# Patient Record
Sex: Female | Born: 1980 | Race: White | Hispanic: No | Marital: Married | Smoking: Never smoker
Health system: Southern US, Community
[De-identification: ages and names within clinical notes are randomized; demographics above are authoritative.]

## PROBLEM LIST (undated history)

## (undated) DIAGNOSIS — N63 Unspecified lump in unspecified breast: Secondary | ICD-10-CM

## (undated) DIAGNOSIS — IMO0002 Reserved for concepts with insufficient information to code with codable children: Secondary | ICD-10-CM

## (undated) HISTORY — DX: Reserved for concepts with insufficient information to code with codable children: IMO0002

## (undated) HISTORY — DX: Unspecified lump in unspecified breast: N63.0

---

## 2002-04-02 HISTORY — PX: BREAST SURGERY: SHX581

## 2004-03-14 ENCOUNTER — Observation Stay: Payer: Self-pay | Admitting: Unknown Physician Specialty

## 2004-05-27 ENCOUNTER — Inpatient Hospital Stay: Payer: Self-pay | Admitting: Unknown Physician Specialty

## 2004-09-30 ENCOUNTER — Emergency Department: Payer: Self-pay | Admitting: Emergency Medicine

## 2007-06-09 ENCOUNTER — Ambulatory Visit: Payer: Self-pay

## 2007-08-22 ENCOUNTER — Inpatient Hospital Stay: Payer: Self-pay

## 2009-04-02 HISTORY — PX: INTRAUTERINE DEVICE INSERTION: SHX323

## 2009-04-10 ENCOUNTER — Emergency Department: Payer: Self-pay | Admitting: Unknown Physician Specialty

## 2009-12-07 ENCOUNTER — Ambulatory Visit: Payer: Self-pay | Admitting: Internal Medicine

## 2010-09-16 ENCOUNTER — Emergency Department: Payer: Self-pay | Admitting: Emergency Medicine

## 2011-09-17 IMAGING — CT CT HEAD WITHOUT CONTRAST
1 series · 16 of 29 positions shown, 20 images · non-contrast
Comparison: none

REASON FOR EXAM: STAT CR#[id number]     Headache
COMMENTS:

[Series 2: soft tissue · axial · 0.39mm/px · z∈[-72,+58]mm · 16 of 29 slices shown, 20 images]
[im 2/29  brain]
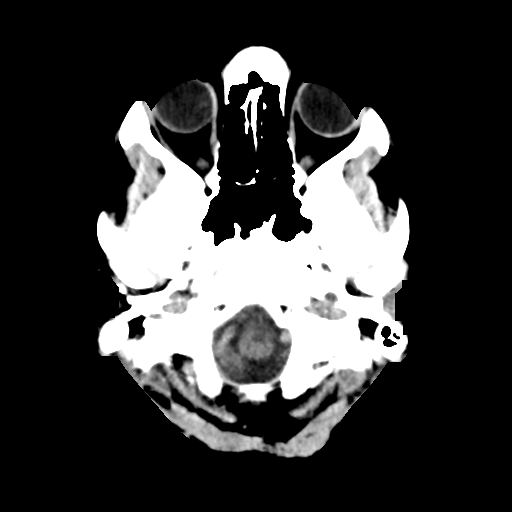
[im 2/29  bone]
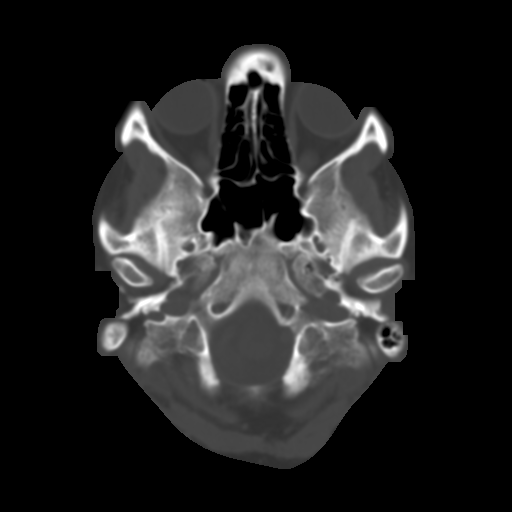
[im 4/29  brain]
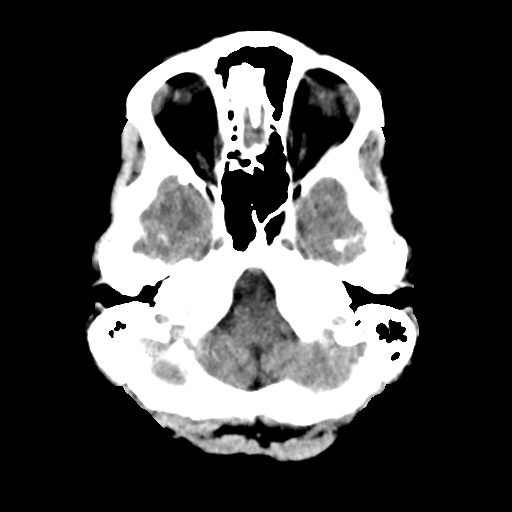
[im 6/29  brain]
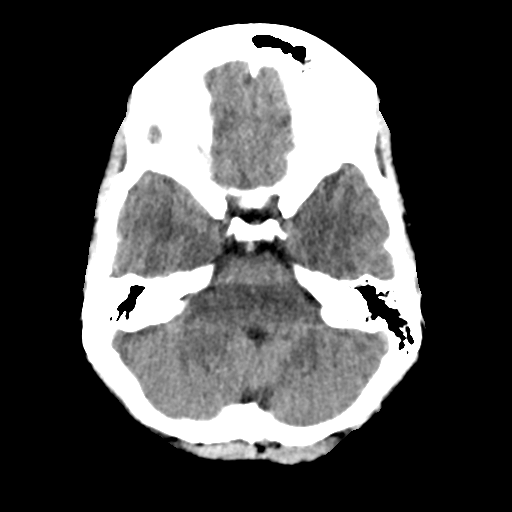
[im 7/29  brain]
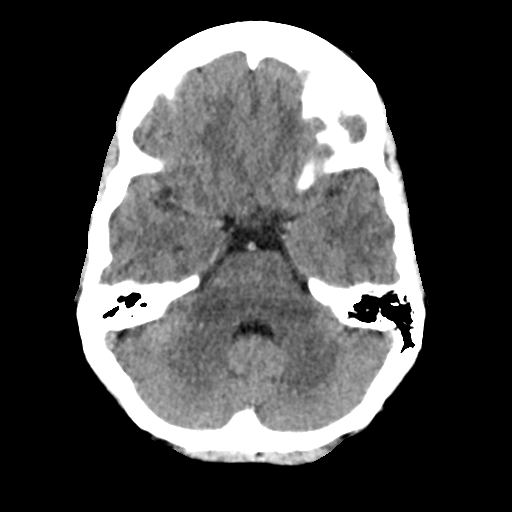
[im 9/29  brain]
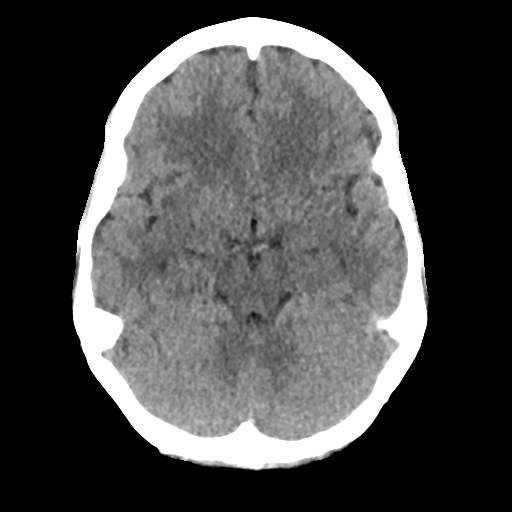
[im 9/29  bone]
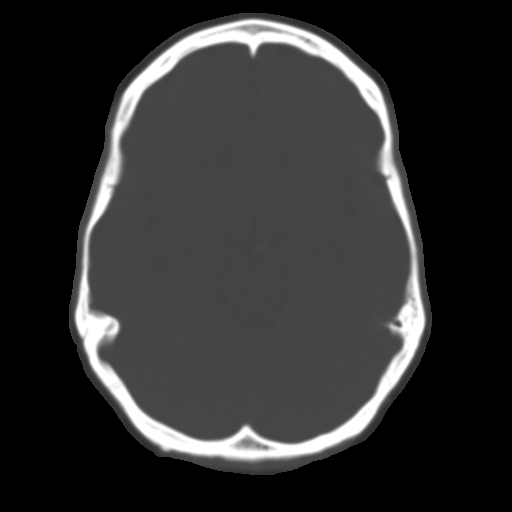
[im 11/29  brain]
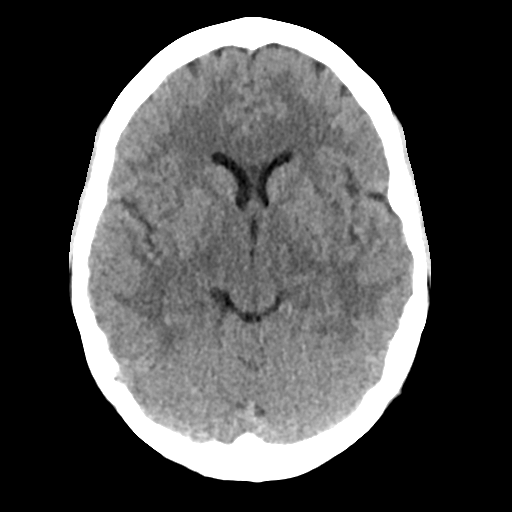
[im 12/29  brain]
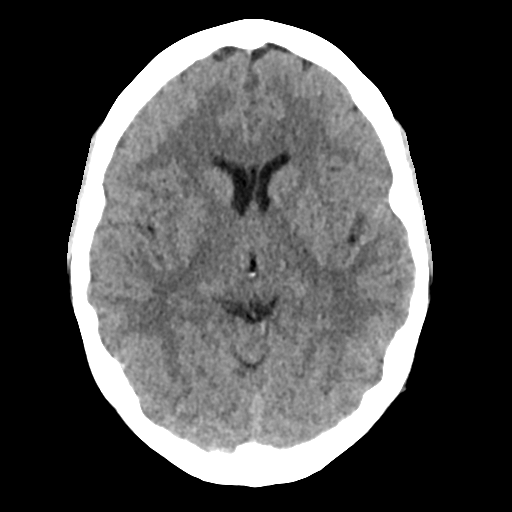
[im 14/29  brain]
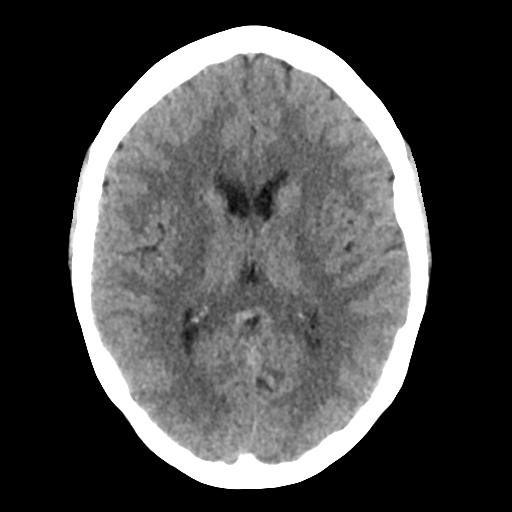
[im 16/29  brain]
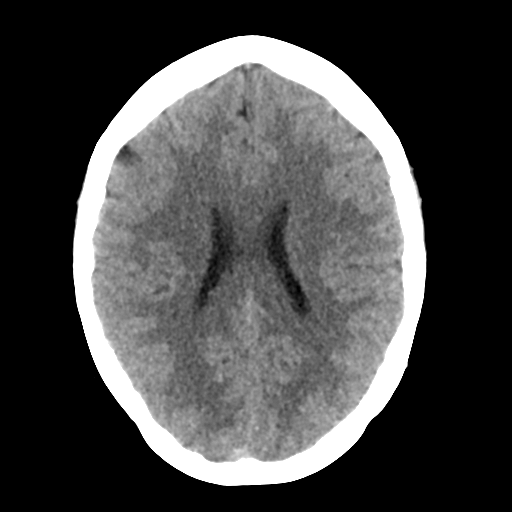
[im 16/29  bone]
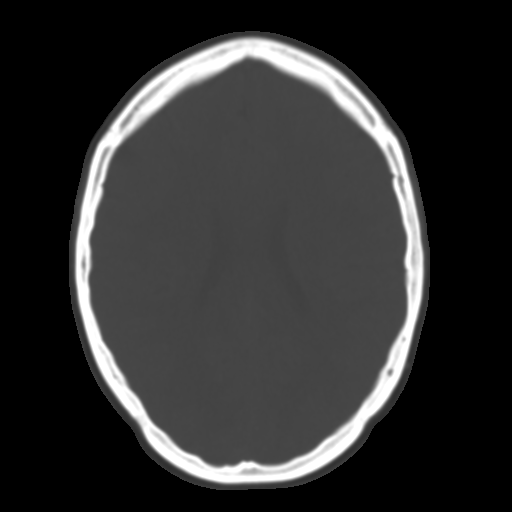
[im 18/29  brain]
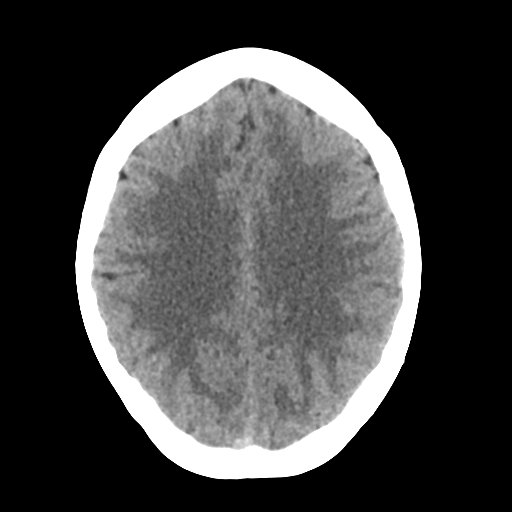
[im 19/29  brain]
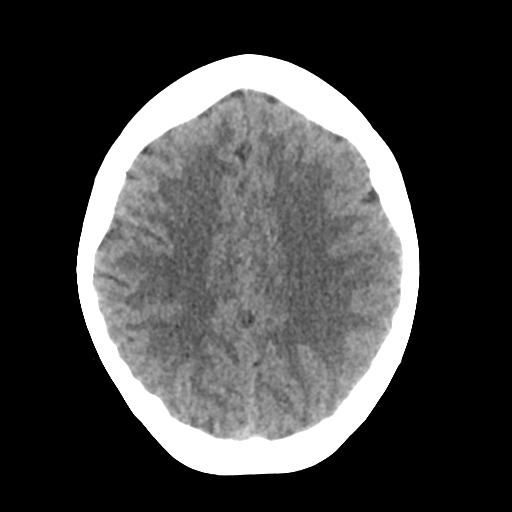
[im 21/29  brain]
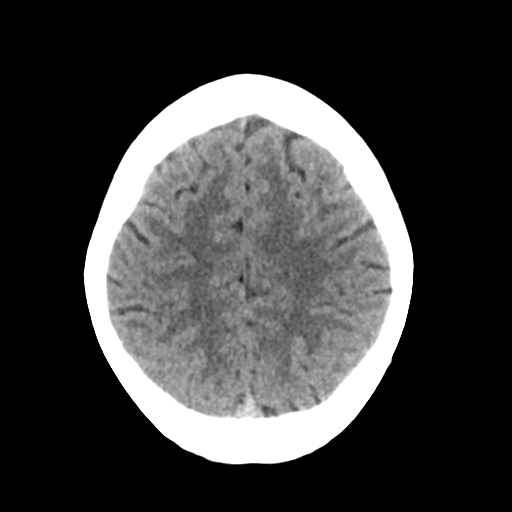
[im 23/29  brain]
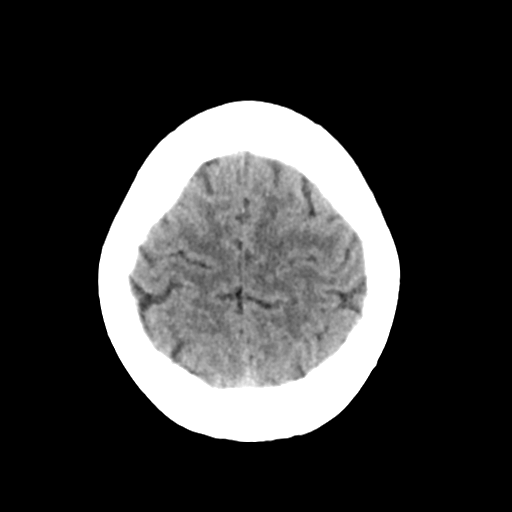
[im 23/29  bone]
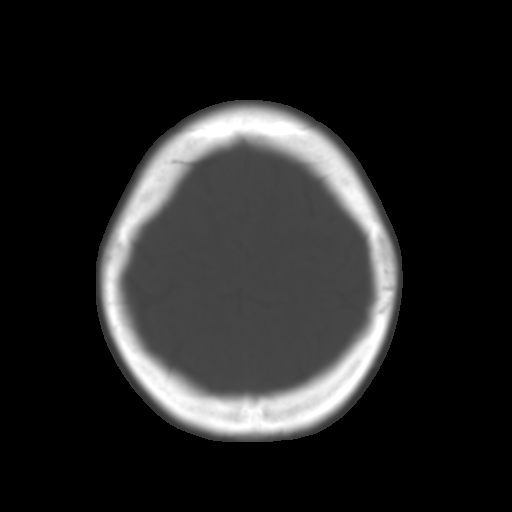
[im 24/29  brain]
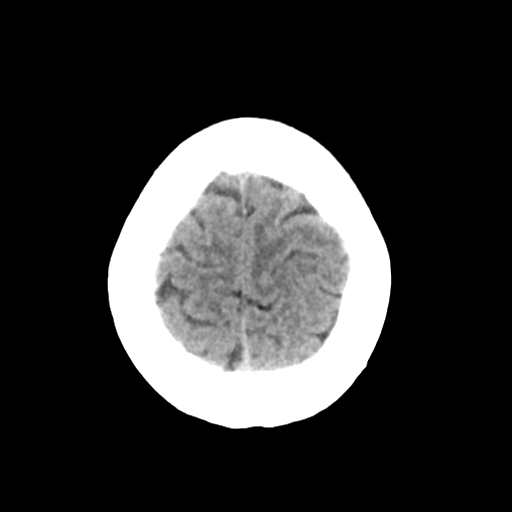
[im 26/29  brain]
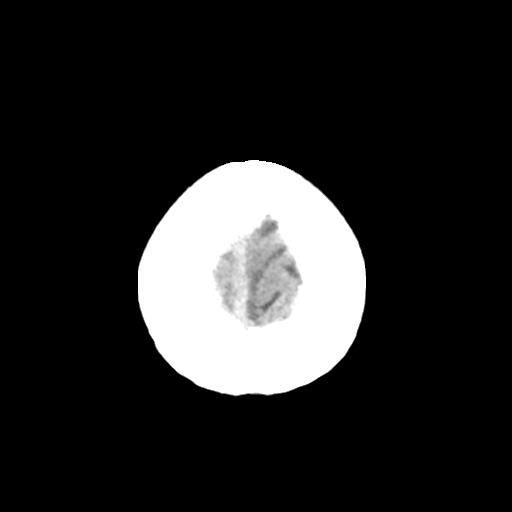
[im 28/29  brain]
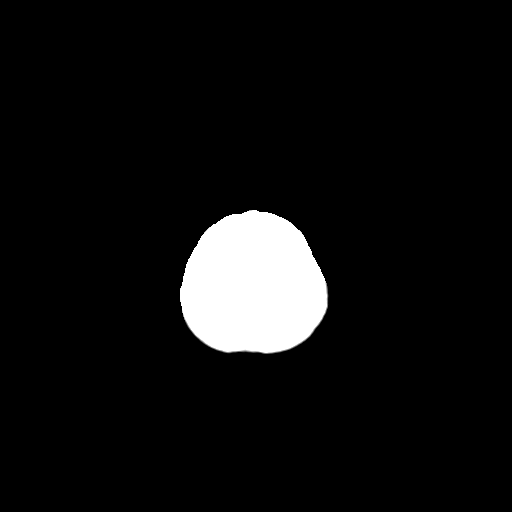

[16 of 29 positions shown; findings below may reference images not displayed]

PROCEDURE:     KCT - KCT HEAD WITHOUT CONTRAST  - December 07, 2009  [DATE]

RESULT:     Noncontrast emergent CT of the brain is performed in the
standard fashion. The patient has no previous examination for comparison.

The ventricles and sulci are normal. There is no hemorrhage. There is no
focal mass, mass-effect or midline shift. There is no evidence of edema or
territorial infarct. The bone windows demonstrate normal aeration of the
paranasal sinuses and mastoid air cells. There is no skull fracture
demonstrated.
IMPRESSION: 1. No acute intracranial abnormality.

## 2012-05-21 DIAGNOSIS — N63 Unspecified lump in unspecified breast: Secondary | ICD-10-CM

## 2012-05-21 HISTORY — DX: Unspecified lump in unspecified breast: N63.0

## 2012-06-19 ENCOUNTER — Emergency Department: Payer: Self-pay | Admitting: Emergency Medicine

## 2012-07-17 ENCOUNTER — Encounter: Payer: Self-pay | Admitting: *Deleted

## 2012-07-17 DIAGNOSIS — N63 Unspecified lump in unspecified breast: Secondary | ICD-10-CM | POA: Insufficient documentation

## 2012-07-17 DIAGNOSIS — IMO0002 Reserved for concepts with insufficient information to code with codable children: Secondary | ICD-10-CM | POA: Insufficient documentation

## 2012-07-22 ENCOUNTER — Ambulatory Visit (INDEPENDENT_AMBULATORY_CARE_PROVIDER_SITE_OTHER): Payer: 59 | Admitting: General Surgery

## 2012-07-22 ENCOUNTER — Other Ambulatory Visit: Payer: Self-pay

## 2012-07-22 ENCOUNTER — Encounter: Payer: Self-pay | Admitting: General Surgery

## 2012-07-22 VITALS — BP 118/82 | HR 80 | Resp 16 | Ht 63.0 in | Wt 168.0 lb

## 2012-07-22 DIAGNOSIS — D249 Benign neoplasm of unspecified breast: Secondary | ICD-10-CM | POA: Insufficient documentation

## 2012-07-22 DIAGNOSIS — D241 Benign neoplasm of right breast: Secondary | ICD-10-CM

## 2012-07-22 NOTE — Progress Notes (Signed)
Patient ID: Julie Buck, female   DOB: 1980/11/24, 32 y.o.   MRN: [id number]  Chief Complaint  Patient presents with  . Follow-up    2 month follow up from right breast biopsy    HPI Julie Buck is a 32 y.o. female who presents for a 2 month follow up from right breast biopsy. The procedure was done on 05/21/2012. 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 The patient denies any new problems with her breasts at this time.  HPI  Past Medical History  Diagnosis Date  . Ulcer     2012  . Lump or mass in breast 05/21/2012    Past Surgical History  Procedure Laterality Date  . Breast surgery Left 2004  . Intrauterine device insertion  2011    History reviewed. No pertinent family history.  Social History History  Substance Use Topics  . Smoking status: Never Smoker   . Smokeless tobacco: Not on file  . Alcohol Use: No    No Known Allergies  Current Outpatient Prescriptions  Medication Sig Dispense Refill  . FLUoxetine (PROZAC) 20 MG capsule Take 20 mg by mouth daily.       No current facility-administered medications for this visit.    Review of Systems Review of Systems  Constitutional: Negative.   Respiratory: Negative.   Cardiovascular: Negative.     Blood pressure 118/82, pulse 80, resp. rate 16, height 5\' 3"  (1.6 m), weight 168 lb (76.204 kg).  Physical Exam Physical Exam  Constitutional: She appears well-developed and well-nourished.  Neck: No thyromegaly present.  Pulmonary/Chest: Right breast exhibits no inverted nipple, no mass, no nipple discharge, no skin change and no tenderness. Left breast exhibits no inverted nipple, no mass, no nipple discharge, no skin change and no tenderness. Breasts are symmetrical.  Lymphadenopathy:    She has no cervical adenopathy.    She has no axillary adenopathy.    Data Reviewed Ultrasound was performed here and shows a stable mass at 2:00 in the right breast  Assessment    Stable fibroadenoma the right breast    Plan   Patient advised no need for excision of this unless it becomes symptomatic or enlarges, we'll continue periodic followup       SANKAR,SEEPLAPUTHUR G 07/23/2012, 5:56 AM

## 2012-07-22 NOTE — Patient Instructions (Addendum)
Patient to return for follow up in 6 months. Call for any breast symptoms.

## 2012-07-23 ENCOUNTER — Encounter: Payer: Self-pay | Admitting: General Surgery

## 2013-01-26 ENCOUNTER — Ambulatory Visit: Payer: 59 | Admitting: General Surgery

## 2013-02-24 ENCOUNTER — Encounter: Payer: Self-pay | Admitting: *Deleted

## 2014-02-01 ENCOUNTER — Encounter: Payer: Self-pay | Admitting: General Surgery

## 2018-06-19 ENCOUNTER — Ambulatory Visit: Payer: Self-pay | Admitting: Certified Nurse Midwife

## 2018-08-11 ENCOUNTER — Ambulatory Visit: Payer: Self-pay | Admitting: Certified Nurse Midwife

## 2020-12-31 ENCOUNTER — Emergency Department
Admission: EM | Admit: 2020-12-31 | Discharge: 2020-12-31 | Disposition: A | Discharge status: Home / Self Care | Payer: Managed Care, Other (non HMO) | Attending: Emergency Medicine | Admitting: Emergency Medicine

## 2020-12-31 ENCOUNTER — Emergency Department: Payer: Managed Care, Other (non HMO)

## 2020-12-31 ENCOUNTER — Encounter: Payer: Self-pay | Admitting: Emergency Medicine

## 2020-12-31 ENCOUNTER — Other Ambulatory Visit: Payer: Self-pay

## 2020-12-31 DIAGNOSIS — S99911A Unspecified injury of right ankle, initial encounter: Secondary | ICD-10-CM | POA: Diagnosis present

## 2020-12-31 DIAGNOSIS — Z853 Personal history of malignant neoplasm of breast: Secondary | ICD-10-CM | POA: Insufficient documentation

## 2020-12-31 DIAGNOSIS — X501XXA Overexertion from prolonged static or awkward postures, initial encounter: Secondary | ICD-10-CM | POA: Insufficient documentation

## 2020-12-31 DIAGNOSIS — S93401A Sprain of unspecified ligament of right ankle, initial encounter: Secondary | ICD-10-CM | POA: Insufficient documentation

## 2020-12-31 MED ORDER — MELOXICAM 15 MG PO TABS: 15.0000 mg | ORAL_TABLET | Freq: Every day | ORAL | 2 refills | Status: DC

## 2020-12-31 NOTE — ED Provider Notes (Signed)
[facility]-EMERGENCY DEPARTMENT  ____________________________________________  Time seen: Approximately 10:56 PM  I have reviewed the triage vital signs and the nursing notes.   HISTORY  Chief Complaint Ankle Pain   Historian Patient     HPI Julie Buck is a 40 y.o. female presents to the emergency with right lateral ankle pain after patient fell off a stepstool and inverted her right ankle.  She did not hit her head or her neck.  She had difficulty bearing weight since injury occurred.  No abrasions or lacerations.  No prior ankle sprains in the past.   Past Medical History:  Diagnosis Date   Lump or mass in breast 05/21/2012   Ulcer    2012     Immunizations up to date:  Yes.     Past Medical History:  Diagnosis Date   Lump or mass in breast 05/21/2012   Ulcer    2012    Patient Active Problem List   Diagnosis Date Noted   Benign neoplasm of breast 07/22/2012   Ulcer    Lump or mass in breast     Past Surgical History:  Procedure Laterality Date   BREAST SURGERY Left 2004   INTRAUTERINE DEVICE INSERTION  2011    Prior to Admission medications   Medication Sig Start Date End Date Taking? Authorizing Provider  meloxicam (MOBIC) 15 MG tablet Take 1 tablet (15 mg total) by mouth daily. 12/31/20 12/31/21 Yes Vallarie Mare M, PA-C  FLUoxetine (PROZAC) 20 MG capsule Take 20 mg by mouth daily.    [provider]    Allergies Patient has no known allergies.  History reviewed. No pertinent family history.  Social History Social History   Tobacco Use   Smoking status: Never  Substance Use Topics   Alcohol use: No   Drug use: No     Review of Systems  Constitutional: No fever/chills Eyes:  No discharge ENT: No upper respiratory complaints. Respiratory: no cough. No SOB/ use of accessory muscles to breath Gastrointestinal:   No nausea, no vomiting.  No diarrhea.  No constipation. Musculoskeletal: Patient has right ankle pain.  Skin:  Negative for rash, abrasions, lacerations, ecchymosis.    ____________________________________________   PHYSICAL EXAM:  VITAL SIGNS: ED Triage Vitals [12/31/20 2159]  Enc Vitals Group     BP 108/85     Pulse Rate (!) 121     Resp 20     Temp 98.1 F (36.7 C)     Temp Source Oral     SpO2 96 %     Weight      Height      Head Circumference      Peak Flow      Pain Score      Pain Loc      Pain Edu?      Excl. in [place]?      Constitutional: Alert and oriented. Well appearing and in no acute distress. Eyes: Conjunctivae are normal. PERRL. EOMI. Head: Atraumatic. ENT:  Cardiovascular: Normal rate, regular rhythm. Normal S1 and S2.  Good peripheral circulation. Respiratory: Normal respiratory effort without tachypnea or retractions. Lungs CTAB. Good air entry to the bases with no decreased or absent breath sounds Gastrointestinal: Bowel sounds x 4 quadrants. Soft and nontender to palpation. No guarding or rigidity. No distention. Musculoskeletal: Patient has tenderness to palpation of the anterior and posterior talofibular ligaments as well as the deltoid ligament.  She is able to move all 5 right toes.  Palpable dorsalis  pedis pulse, right.  Capillary refill less than 2 seconds on the right. Neurologic:  Normal for age. No gross focal neurologic deficits are appreciated.  Skin:  Skin is warm, dry and intact. No rash noted. Psychiatric: Mood and affect are normal for age. Speech and behavior are normal.   ____________________________________________   LABS (all labs ordered are listed, but only abnormal results are displayed)  Labs Reviewed - No data to display ____________________________________________  EKG   ____________________________________________  RADIOLOGY Unk Pinto, personally viewed and evaluated these images (plain radiographs) as part of my medical decision making, as well as reviewing the written report by the radiologist.  DG Ankle Complete  Right  Result Date: 12/31/2020 CLINICAL DATA:  Recent twisting injury with ankle pain and swelling, initial encounter EXAM: RIGHT ANKLE - COMPLETE 3+ VIEW COMPARISON:  09/23/2012 FINDINGS: Soft tissue swelling is noted predominately laterally. No acute fracture or dislocation is noted. IMPRESSION: Soft tissue swelling without acute bony abnormality. Electronically Signed   By: Inez Catalina M.D.   On: 12/31/2020 22:23    ____________________________________________    PROCEDURES  Procedure(s) performed:     Procedures     Medications - No data to display   ____________________________________________   INITIAL IMPRESSION / ASSESSMENT AND PLAN / ED COURSE  Pertinent labs & imaging results that were available during my care of the patient were reviewed by me and considered in my medical decision making (see chart for details).       Assessment and plan Right ankle pain 40 year old female presents to the emergency department with right lateral ankle pain after an inversion type ankle injury.  Patient was tachycardic at triage but vital signs were otherwise reassuring.  She had tenderness to palpation of the anterior and posterior talofibular ligaments and deltoid ligament.  X-ray shows no bony abnormality.  Patient was placed in a boot and crutches were provided.  She was discharged with meloxicam.  Return precautions were given to return with new or worsening symptoms    ____________________________________________  FINAL CLINICAL IMPRESSION(S) / ED DIAGNOSES  Final diagnoses:  Sprain of right ankle, unspecified ligament, initial encounter      NEW MEDICATIONS STARTED DURING THIS VISIT:  ED Discharge Orders          Ordered    meloxicam (MOBIC) 15 MG tablet  Daily        12/31/20 2253                This chart was dictated using voice recognition software/Dragon. Despite best efforts to proofread, errors can occur which can change the meaning. Any  change was purely unintentional.     Lannie Fields, PA-C 12/31/20 2258    Nance Pear, MD 01/01/21 1459

## 2020-12-31 NOTE — ED Triage Notes (Signed)
Pt states she was standing on top of her bed and hanging a wreath. When she jumped down, she twisted her right ankle. Swelling noted. Cms intact. Denies any other injury.

## 2020-12-31 NOTE — Discharge Instructions (Signed)
You can take meloxicam once daily for pain and inflammation.

## 2020-12-31 NOTE — ED Notes (Signed)
Pt declines crutches, has a set at home

## 2021-06-28 ENCOUNTER — Ambulatory Visit: Payer: Managed Care, Other (non HMO) | Admitting: Obstetrics

## 2021-06-28 ENCOUNTER — Other Ambulatory Visit (HOSPITAL_COMMUNITY)
Admission: RE | Admit: 2021-06-28 | Discharge: 2021-06-28 | Disposition: A | Discharge status: Home / Self Care | Payer: Managed Care, Other (non HMO) | Source: Ambulatory Visit | Attending: Obstetrics | Admitting: Obstetrics

## 2021-06-28 VITALS — BP 137/84 | HR 87 | Wt 223.0 lb

## 2021-06-28 DIAGNOSIS — R102 Pelvic and perineal pain: Secondary | ICD-10-CM | POA: Diagnosis not present

## 2021-06-28 NOTE — Progress Notes (Signed)
GYN ENCOUNTER ? ?Encounter for Pelvic Pain ? ?Subjective ? ?HPI: Julie Buck is a 41 y.o. G2P0 who presents today for pelvic pain and vaginal bleeding. She has had an IUD for several years, but she is not sure exactly when it was placed. She thinks it [place] have been 2014 or 2015. For many years, she did not get a period or have any vaginal bleeding. However, for the past year, she has been having progressively worse bleeding and pain. She now has pain and bleeding with intercourse and with straining. She reports that the bleeding is bright red and typically lasts for a day or two, stops, and then resumes again. She also has some bleeding unrelated to intercourse. She is bleeding about 2 weeks out of every month. She denies fever, vaginal discharge or odor, and urinary symptoms. She currently has one sexual partner. She has no personal history of fibroids or uterine cysts.  ? ?Past Medical History:  ?Diagnosis Date  ? Lump or mass in breast 05/21/2012  ? Ulcer   ? 2012  ? ?Past Surgical History:  ?Procedure Laterality Date  ? BREAST SURGERY Left 2004  ? INTRAUTERINE DEVICE INSERTION  2011  ? ?OB History   ? ? Gravida  ?2  ? Para  ?   ? Term  ?   ? Preterm  ?   ? AB  ?   ? Living  ?2  ?  ? ? SAB  ?   ? IAB  ?   ? Ectopic  ?   ? Multiple  ?   ? Live Births  ?   ?   ?  ? Obstetric Comments  ?1st Menstrual Cycle:  15 ?1st Pregnancy:  22  ?  ? ?  ? ?No Known Allergies ? ?Negative except as noted in HPI ?History obtained from the patient ? ?Objective ? ?BP 137/84   Pulse 87   Wt 223 lb (101.2 kg)   BMI 39.50 kg/m?  ? ?General appearance: alert, cooperative, appears stated age ?Abdomen: soft, no organomegaly. Tenderness to palpation in lower left quadrant. ?Pelvic: External genitalia normal, Vagina normal without discharge, cervix normal in appearance, not friable. Nabothian cyst noted, no polyps, IUD strings visualized. Brown blood noted in os. + cervical motion tenderness. No masses felt on bimanual exam. Pain with  manipulation of cervix. ? ?Assessment ?1) Dyspareunia and abnormal bleeding: IUD malposition vs. infection vs. uterine/ovarian cause ? ?Plan ?Discussed possible causes of pelvic pain. Reviewed that IUD is likely expired at this point. Bleeding appears to be uterine and not from a cervical lesion. Offered to remove IUD today. Discussed [place] to determine position of IUD and investigate possible fibroids or ovarian cysts. Julie Buck declines STI testing but would like a swab for BV and yeast.  ?She strongly desires to avoid a future pregnancy. We discussed options for birth control, including BTL. She is considering that and would like to keep her IUD in place today.  ? ?She plans to schedule a pelvic US followed by removal and replacement of her IUD. ? ? ?Julie Buck, CNM ? ? ?  ?

## 2021-07-03 LAB — CERVICOVAGINAL ANCILLARY ONLY
Bacterial Vaginitis (gardnerella): NEGATIVE
Candida Glabrata: NEGATIVE
Candida Vaginitis: NEGATIVE
Comment: NEGATIVE
Comment: NEGATIVE
Comment: NEGATIVE

## 2021-07-16 ENCOUNTER — Encounter: Payer: Self-pay | Admitting: Obstetrics

## 2021-08-08 ENCOUNTER — Encounter: Payer: Managed Care, Other (non HMO) | Admitting: Certified Nurse Midwife

## 2021-08-08 ENCOUNTER — Other Ambulatory Visit: Payer: Managed Care, Other (non HMO)

## 2021-08-10 ENCOUNTER — Encounter: Payer: Managed Care, Other (non HMO) | Admitting: Certified Nurse Midwife

## 2021-08-16 ENCOUNTER — Other Ambulatory Visit: Payer: Managed Care, Other (non HMO)

## 2021-08-22 ENCOUNTER — Emergency Department: Payer: Managed Care, Other (non HMO)

## 2021-08-22 ENCOUNTER — Other Ambulatory Visit: Payer: Self-pay

## 2021-08-22 ENCOUNTER — Encounter: Payer: Self-pay | Admitting: Emergency Medicine

## 2021-08-22 DIAGNOSIS — Z5321 Procedure and treatment not carried out due to patient leaving prior to being seen by health care provider: Secondary | ICD-10-CM | POA: Insufficient documentation

## 2021-08-22 DIAGNOSIS — R110 Nausea: Secondary | ICD-10-CM | POA: Insufficient documentation

## 2021-08-22 DIAGNOSIS — R072 Precordial pain: Secondary | ICD-10-CM | POA: Insufficient documentation

## 2021-08-22 LAB — BASIC METABOLIC PANEL
Anion gap: 8 (ref 5–15)
BUN: 11 mg/dL (ref 6–20)
CO2: 25 mmol/L (ref 22–32)
Calcium: 8.4 mg/dL — ABNORMAL LOW (ref 8.9–10.3)
Chloride: 106 mmol/L (ref 98–111)
Creatinine, Ser: 0.79 mg/dL (ref 0.44–1.00)
GFR, Estimated: >60 mL/min (ref >60)
Glucose, Bld: 104 mg/dL — ABNORMAL HIGH (ref 70–99)
Potassium: 3.2 mmol/L — ABNORMAL LOW (ref 3.5–5.1)
Sodium: 139 mmol/L (ref 135–145)

## 2021-08-22 LAB — TROPONIN I (HIGH SENSITIVITY): Troponin I (High Sensitivity): 2 ng/L (ref <18)

## 2021-08-22 LAB — CBC
HCT: 40 % (ref 36.0–46.0)
Hemoglobin: 13 g/dL (ref 12.0–15.0)
MCH: 29.2 pg (ref 26.0–34.0)
MCHC: 32.5 g/dL (ref 30.0–36.0)
MCV: 89.9 fL (ref 80.0–100.0)
Platelets: 281 10*3/uL (ref 150–400)
RBC: 4.45 MIL/uL (ref 3.87–5.11)
RDW: 11.9 % (ref 11.5–15.5)
WBC: 11.4 10*3/uL — ABNORMAL HIGH (ref 4.0–10.5)
nRBC: 0 % (ref 0.0–0.2)

## 2021-08-22 MED ORDER — ONDANSETRON 4 MG PO TBDP
4.0000 mg | ORAL_TABLET | Freq: Once | ORAL | Status: AC
  Administered 2021-08-22: 4 mg via ORAL
  Filled 2021-08-22: qty 1

## 2021-08-22 NOTE — ED Triage Notes (Signed)
Pt presents via POV with complaints of midsternal CP that started around 4 hours ago. She endorses nausea but denies emesis. Pt denies taking any pain meds PTA. Denies cough, fevers, or chill.

## 2021-08-23 ENCOUNTER — Emergency Department
Admission: EM | Admit: 2021-08-23 | Discharge: 2021-08-23 | Discharge status: Against Medical Advice | Payer: Managed Care, Other (non HMO) | Attending: Emergency Medicine | Admitting: Emergency Medicine

## 2021-08-29 ENCOUNTER — Other Ambulatory Visit: Payer: Self-pay | Admitting: Obstetrics

## 2021-08-29 ENCOUNTER — Encounter: Payer: Self-pay | Admitting: Certified Nurse Midwife

## 2021-08-29 ENCOUNTER — Ambulatory Visit (INDEPENDENT_AMBULATORY_CARE_PROVIDER_SITE_OTHER): Payer: Managed Care, Other (non HMO)

## 2021-08-29 ENCOUNTER — Ambulatory Visit: Payer: Managed Care, Other (non HMO) | Admitting: Certified Nurse Midwife

## 2021-08-29 VITALS — BP 143/87 | HR 75 | Ht 62.0 in | Wt 219.7 lb

## 2021-08-29 DIAGNOSIS — Z30433 Encounter for removal and reinsertion of intrauterine contraceptive device: Secondary | ICD-10-CM

## 2021-08-29 DIAGNOSIS — R102 Pelvic and perineal pain: Secondary | ICD-10-CM

## 2021-08-29 NOTE — Progress Notes (Signed)
   GYNECOLOGY OFFICE PROCEDURE NOTE  Julie Buck is a 41 y.o. G2P0 here for Mirena IUD removal and reinsertion. No GYN concerns.  Last pap smear was on 3-4 yrs ago and was normal.  IUD Removal and Reinsertion  Patient identified, informed consent performed, consent signed.   Discussed risks of irregular bleeding, cramping, infection, malpositioning or misplacement of the IUD outside the uterus which [place] require further procedures. Also discussed >99% contraception efficacy, increased risk of ectopic pregnancy with failure of method.   Emphasized that this did not protect against STIs, condoms recommended during all sexual encounters.Advised to use backup contraception for one week as the risk of pregnancy is higher during the transition period of removing an IUD and replacing it with another one. Time out was performed. Speculum placed in the vagina. The strings of the IUD were grasped and pulled using ring forceps. The IUD was successfully removed in its entirety. The cervix was cleaned with Betadine x 2 and grasped anteriorly with a single tooth tenaculum.  The new mirena IUD insertion apparatus was used to sound the uterus to 8 cm;  the IUD was then placed per manufacturer's recommendations. Strings trimmed to 3 cm. Tenaculum was removed, good hemostasis noted. Patient tolerated procedure well.   Patient was given post-procedure instructions.  She was reminded to have backup contraception for one week during this transition period between IUDs.  Patient was also asked to check IUD strings periodically and follow up in 4 weeks for IUD check/annual exam.   Philip Aspen, CNM

## 2021-08-29 NOTE — Patient Instructions (Signed)
Preventive Care 21-39 Years Old, Female Preventive care refers to lifestyle choices and visits with your health care provider that can promote health and wellness. Preventive care visits are also called wellness exams. What can I expect for my preventive care visit? Counseling During your preventive care visit, your health care provider [place] ask about your: Medical history, including: Past medical problems. Family medical history. Pregnancy history. Current health, including: Menstrual cycle. Method of birth control. Emotional well-being. Home life and relationship well-being. Sexual activity and sexual health. Lifestyle, including: Alcohol, nicotine or tobacco, and drug use. Access to firearms. Diet, exercise, and sleep habits. Work and work environment. Sunscreen use. Safety issues such as seatbelt and bike helmet use. Physical exam Your health care provider [place] check your: Height and weight. These Guagliardo be used to calculate your BMI (body mass index). BMI is a measurement that tells if you are at a healthy weight. Waist circumference. This measures the distance around your waistline. This measurement also tells if you are at a healthy weight and Benassi help predict your risk of certain diseases, such as type 2 diabetes and high blood pressure. Heart rate and blood pressure. Body temperature. Skin for abnormal spots. What immunizations do I need?  Vaccines are usually given at various ages, according to a schedule. Your health care provider will recommend vaccines for you based on your age, medical history, and lifestyle or other factors, such as travel or where you work. What tests do I need? Screening Your health care provider [place] recommend screening tests for certain conditions. This [place] include: Pelvic exam and Pap test. Lipid and cholesterol levels. Diabetes screening. This is done by checking your blood sugar (glucose) after you have not eaten for a while (fasting). Hepatitis  B test. Hepatitis C test. HIV (human immunodeficiency virus) test. STI (sexually transmitted infection) testing, if you are at risk. BRCA-related cancer screening. This [place] be done if you have a family history of breast, ovarian, tubal, or peritoneal cancers. Talk with your health care provider about your test results, treatment options, and if necessary, the need for more tests. Follow these instructions at home: Eating and drinking  Eat a healthy diet that includes fresh fruits and vegetables, whole grains, lean protein, and low-fat dairy products. Take vitamin and mineral supplements as recommended by your health care provider. Do not drink alcohol if: Your health care provider tells you not to drink. You are pregnant, [place] be pregnant, or are planning to become pregnant. If you drink alcohol: Limit how much you have to 0-1 drink a day. Know how much alcohol is in your drink. In the [place] one drink equals one 12 oz bottle of beer (355 mL), one 5 oz glass of wine (148 mL), or one 1 oz glass of hard liquor (44 mL). Lifestyle Brush your teeth every morning and night with fluoride toothpaste. Floss one time each day. Exercise for at least 30 minutes 5 or more days each week. Do not use any products that contain nicotine or tobacco. These products include cigarettes, chewing tobacco, and vaping devices, such as e-cigarettes. If you need help quitting, ask your health care provider. Do not use drugs. If you are sexually active, practice safe sex. Use a condom or other form of protection to prevent STIs. If you do not wish to become pregnant, use a form of birth control. If you plan to become pregnant, see your health care provider for a prepregnancy visit. Find healthy ways to manage stress, such as: Meditation,   yoga, or listening to music. Journaling. Talking to a trusted person. Spending time with friends and family. Minimize exposure to UV radiation to reduce your risk of skin  cancer. Safety Always wear your seat belt while driving or riding in a vehicle. Do not drive: If you have been drinking alcohol. Do not ride with someone who has been drinking. If you have been using any mind-altering substances or drugs. While texting. When you are tired or distracted. Wear a helmet and other protective equipment during sports activities. If you have firearms in your house, make sure you follow all gun safety procedures. Seek help if you have been physically or sexually abused. What's next? Go to your health care provider once a year for an annual wellness visit. Ask your health care provider how often you should have your eyes and teeth checked. Stay up to date on all vaccines. This information is not intended to replace advice given to you by your health care provider. Make sure you discuss any questions you have with your health care provider. Document Revised: 09/14/2020 Document Reviewed: 09/14/2020 Elsevier Patient Education  2023 [employer]  

## 2021-08-30 ENCOUNTER — Other Ambulatory Visit: Payer: Self-pay | Admitting: Family Medicine

## 2021-08-30 DIAGNOSIS — R1013 Epigastric pain: Secondary | ICD-10-CM

## 2021-09-06 ENCOUNTER — Ambulatory Visit
Admission: RE | Admit: 2021-09-06 | Discharge: 2021-09-06 | Disposition: A | Discharge status: Home / Self Care | Payer: Managed Care, Other (non HMO) | Source: Ambulatory Visit | Attending: Family Medicine | Admitting: Family Medicine

## 2021-09-06 DIAGNOSIS — R1013 Epigastric pain: Secondary | ICD-10-CM | POA: Diagnosis present

## 2021-09-15 ENCOUNTER — Ambulatory Visit: Payer: Managed Care, Other (non HMO) | Admitting: Surgery

## 2021-09-15 ENCOUNTER — Encounter: Payer: Self-pay | Admitting: Surgery

## 2021-09-15 ENCOUNTER — Telehealth: Payer: Self-pay | Admitting: Surgery

## 2021-09-15 VITALS — BP 135/86 | HR 83 | Temp 98.6°F | Ht 62.0 in | Wt 213.0 lb

## 2021-09-15 DIAGNOSIS — K8020 Calculus of gallbladder without cholecystitis without obstruction: Secondary | ICD-10-CM

## 2021-09-15 NOTE — Progress Notes (Unsigned)
09/15/2021  Reason for Visit:  Symptomatic cholelithiasis  Requesting Provider:  Threasa Alpha, FNP  History of Present Illness: Julie Buck is a 41 y.o. female presenting for evaluation of cholelithiasis.  The patient reports having RUQ and epigastric pain for a long time.  Initially the pain had been more episodic and intermittent, associated with nausea but no emesis.  More recently, she had an episode of upper epigastric pain and RUQ pain with nausea, and she presented to the ED on 08/22/21.  She had labwork done which had a mildly elevated WBC of 11.4, normal troponin, but the patient left the ED without being seen due to waiting times.  She followed up with her PCP who ordered an outpatient ultrasound.  This was done on 09/06/21, which showed significant cholelithiasis with her gallbladder being full of stones.    Today, the patient reports that her symptoms are almost constant.  There's always some degree of RUQ and epigastric pain or discomfort and some nausea.  She is able to keep food down, but has resorted to small meals at a time.  Sometimes she has diarrhea right after meals and has lost weight recently.  Denies any blood in the stool, other areas of abdominal pain, fevers, chills.  Past Medical History: Past Medical History:  Diagnosis Date   Lump or mass in breast 05/21/2012   Ulcer    2012     Past Surgical History: Past Surgical History:  Procedure Laterality Date   BREAST SURGERY Left 2004   INTRAUTERINE DEVICE INSERTION  2011    Home Medications: Prior to Admission medications   Not on File    Allergies: No Known Allergies  Social History:  reports that she has never smoked. She does not have any smokeless tobacco history on file. She reports that she does not drink alcohol and does not use drugs.   Family History: History reviewed. No pertinent family history.  Review of Systems: Review of Systems  Constitutional:  Negative for chills and fever.  HENT:   Negative for hearing loss.   Respiratory:  Negative for shortness of breath.   Cardiovascular:  Negative for chest pain.  Gastrointestinal:  Positive for abdominal pain, diarrhea and nausea. Negative for vomiting.  Genitourinary:  Negative for dysuria.  Musculoskeletal:  Negative for myalgias.  Skin:  Negative for rash.  Neurological:  Negative for dizziness.  Psychiatric/Behavioral:  Negative for depression.     Physical Exam BP 135/86   Pulse 83   Temp 98.6 F (37 C) (Oral)   Ht '5\' 2"'$  (1.575 m)   Wt 213 lb (96.6 kg)   LMP 08/14/2021 (Exact Date)   SpO2 99%   BMI 38.96 kg/m  CONSTITUTIONAL: No acute distress, well nourished. HEENT:  Normocephalic, atraumatic, extraocular motion intact. NECK: Trachea is midline, and there is no jugular venous distension.  RESPIRATORY:  Lungs are clear, and breath sounds are equal bilaterally. Normal respiratory effort without pathologic use of accessory muscles. CARDIOVASCULAR: Heart is regular without murmurs, gallops, or rubs. GI: The abdomen is soft, non-distended, with some tenderness in the RUQ and epigastric areas.  Negative Murphy's sign.  MUSCULOSKELETAL:  Normal muscle strength and tone in all four extremities.  No peripheral edema or cyanosis. SKIN: Skin turgor is normal. There are no pathologic skin lesions.  NEUROLOGIC:  Motor and sensation is grossly normal.  Cranial nerves are grossly intact. PSYCH:  Alert and oriented to person, place and time. Affect is normal.  Laboratory Analysis: Labs from 08/22/21:  Na 139, K 3.2, Cl 106, CO2 25, BUN 11, Cr 0.79.  WBC 11.4, Hgb 13, Hct 40, Plt 281.  Imaging: Ultrasound Abdomen 09/06/21: IMPRESSION: Cholelithiasis.  Gallbladder is completely filled with stones.  Assessment and Plan: This is a 41 y.o. female with symptomatic cholelithiasis.  --Discussed with the patient the role of the gallbladder and how gallstones form and result in biliary colic episodes.  Her gallbladder per ultrasound  is completely filled with stones, and likely this is the reason for her continued pain and diarrhea.  Discussed with her the need for cholecystectomy, as I do not think any conservative measures are going to help in this case.  She's in agreement. --Discussed with her the plan for a robotic assisted cholecystectomy.  Reviewed the surgery at length with her including the risks of bleeding, infection, injury to surrounding structures, that this is an outpatient surgery, post-op activity restrictions, pain control, the use of ICG, and she's willing to proceed. --Will schedule her for 09/21/21.  Gave the patient return precautions. --All questions have been answered.   I spent 80 minutes dedicated to the care of this patient on the date of this encounter to include pre-visit review of records, face-to-face time with the patient discussing diagnosis and management, and any post-visit coordination of care.   Melvyn Neth, [facility]

## 2021-09-15 NOTE — Telephone Encounter (Signed)
Patient has been advised of Pre-Admission date/time, COVID Testing date and Surgery date.  Surgery Date: 09/21/21 Preadmission Testing Date: 09/19/21 (phone 8a-1p) Covid Testing Date: Not needed.     Patient has been made aware to call [phone], between 1-3:00pm the day before surgery, to find out what time to arrive for surgery.

## 2021-09-15 NOTE — Patient Instructions (Signed)
Our surgery scheduler Barbara will call you within 24-48 hours to get you scheduled. If you have not heard from her after 48 hours, please call our office. Have the blue sheet available when she calls to write down important information.   If you have any concerns or questions, please feel free to call our office.    Minimally Invasive Cholecystectomy  Minimally invasive cholecystectomy is surgery to remove the gallbladder. The gallbladder is a pear-shaped organ that lies beneath the liver on the right side of the body. The gallbladder stores bile, which is a fluid that helps the body digest fats. Cholecystectomy is often done to treat inflammation (irritation and swelling) of the gallbladder (cholecystitis). This condition is usually caused by a buildup of gallstones (cholelithiasis) in the gallbladder or when the fluid in the gall bladder becomes stagnant because gallstones get stuck in the ducts (tubes) and block the flow of bile. This can result in inflammation and pain. In severe cases, emergency surgery No be required. This procedure is done through small incisions in the abdomen, instead of one large incision. It is also called laparoscopic surgery. A thin scope with a camera (laparoscope) is inserted through one incision. Then surgical instruments are inserted through the other incisions. In some cases, a minimally invasive surgery Denne need to be changed to a surgery that is done through a larger incision. This is called open surgery. Tell a health care provider about: Any allergies you have. All medicines you are taking, including vitamins, herbs, eye drops, creams, and over-the-counter medicines. Any problems you or family members have had with anesthetic medicines. Any bleeding problems you have. Any surgeries you have had. Any medical conditions you have. Whether you are pregnant or Thiem be pregnant. What are the risks? Generally, this is a safe procedure. However, problems Manolis occur,  including: Infection. Bleeding. Allergic reactions to medicines. Damage to nearby structures or organs. A gallstone remaining in the common bile duct. The common bile duct carries bile from the gallbladder to the small intestine. A bile leak from the liver or cystic duct after your gallbladder is removed. What happens before the procedure? When to stop eating and drinking Follow instructions from your health care provider about what you Weisensel eat and drink before your procedure. These Schneck include: 8 hours before the procedure Stop eating most foods. Do not eat meat, fried foods, or fatty foods. Eat only light foods, such as toast or crackers. All liquids are okay except energy drinks and alcohol. 6 hours before the procedure Stop eating. Drink only clear liquids, such as water, clear fruit juice, black coffee, plain tea, and sports drinks. Do not drink energy drinks or alcohol. 2 hours before the procedure Stop drinking all liquids. You Malmquist be allowed to take medicines with small sips of water. If you do not follow your health care provider's instructions, your procedure Ringstad be delayed or canceled. Medicines Ask your health care provider about: Changing or stopping your regular medicines. This is especially important if you are taking diabetes medicines or blood thinners. Taking medicines such as aspirin and ibuprofen. These medicines can thin your blood. Do not take these medicines unless your health care provider tells you to take them. Taking over-the-counter medicines, vitamins, herbs, and supplements. General instructions If you will be going home right after the procedure, plan to have a responsible adult: Take you home from the hospital or clinic. You will not be allowed to drive. Care for you for the time you are   told. Do not use any products that contain nicotine or tobacco for at least 4 weeks before the procedure. These products include cigarettes, chewing tobacco, and vaping  devices, such as e-cigarettes. If you need help quitting, ask your health care provider. Ask your health care provider: How your surgery site will be marked. What steps will be taken to help prevent infection. These Gaber include: Removing hair at the surgery site. Washing skin with a germ-killing soap. Taking antibiotic medicine. What happens during the procedure?  An IV will be inserted into one of your veins. You will be given one or both of the following: A medicine to help you relax (sedative). A medicine to make you fall asleep (general anesthetic). Your surgeon will make several small incisions in your abdomen. The laparoscope will be inserted through one of the small incisions. The camera on the laparoscope will send images to a monitor in the operating room. This lets your surgeon see inside your abdomen. A gas will be pumped into your abdomen. This will expand your abdomen to give the surgeon more room to perform the surgery. Other tools that are needed for the procedure will be inserted through the other incisions. The gallbladder will be removed through one of the incisions. Your common bile duct Torok be examined. If stones are found in the common bile duct, they Prasad be removed. After your gallbladder has been removed, the incisions will be closed with stitches (sutures), staples, or skin glue. Your incisions will be covered with a bandage (dressing). The procedure Vanoverbeke vary among health care providers and hospitals. What happens after the procedure? Your blood pressure, heart rate, breathing rate, and blood oxygen level will be monitored until you leave the hospital or clinic. You will be given medicines as needed to control your pain. You Ryer have a drain placed in the incision. The drain will be removed a day or two after the procedure. Summary Minimally invasive cholecystectomy, also called laparoscopic cholecystectomy, is surgery to remove the gallbladder using small  incisions. Tell your health care provider about all the medical conditions you have and all the medicines you are taking for those conditions. Before the procedure, follow instructions about when to stop eating and drinking and changing or stopping medicines. Plan to have a responsible adult care for you for the time you are told after you leave the hospital or clinic. This information is not intended to replace advice given to you by your health care provider. Make sure you discuss any questions you have with your health care provider. Document Revised: 09/20/2020 Document Reviewed: 09/20/2020 Elsevier Patient Education  2023 Elsevier Inc.  

## 2021-09-19 ENCOUNTER — Encounter
Admission: RE | Admit: 2021-09-19 | Discharge: 2021-09-19 | Disposition: A | Discharge status: Home / Self Care | Payer: Managed Care, Other (non HMO) | Source: Ambulatory Visit | Attending: Surgery | Admitting: Surgery

## 2021-09-19 VITALS — Ht 62.0 in | Wt 213.0 lb

## 2021-09-19 DIAGNOSIS — Z01812 Encounter for preprocedural laboratory examination: Secondary | ICD-10-CM

## 2021-09-19 NOTE — Patient Instructions (Addendum)
Your procedure is scheduled on:  Report to Day Surgery. To find out your arrival time please call [phone] between 1PM - 3PM on .  Remember: Instructions that are not followed completely Creasy result in serious medical risk,  up to and including death, or upon the discretion of your surgeon and anesthesiologist your  surgery Fiorillo need to be rescheduled.     _X__ 1. Do not eat food after midnight the night before your procedure.                 No chewing gum or hard candies. You Pembroke drink clear liquids up to 2 hours                 before you are scheduled to arrive for your surgery- DO not drink clear                 liquids within 2 hours of the start of your surgery.                 Clear Liquids include:  water, apple juice without pulp, clear Gatorade, G2 or                  Gatorade Zero (avoid Red/Purple/Blue), Black Coffee or Tea (Do not add                 anything to coffee or tea).  __X__2.  On the morning of surgery brush your teeth with toothpaste and water, you                Roemmich rinse your mouth with mouthwash if you wish.  Do not swallow any toothpaste or mouthwash.     _X__ 3.  No Alcohol for 24 hours before or after surgery.   _X__ 4.  Do Not Smoke or use e-cigarettes For 24 Hours Prior to Your Surgery.                 Do not use any chewable tobacco products for at least 6 hours prior to                 Surgery.  _X__  5.  Do not use any recreational drugs (marijuana, cocaine, heroin, ecstasy, MDMA or other)                For at least one week prior to your surgery.  Combination of these drugs with anesthesia                Gutterman have life threatening results.  ____  6.  Bring all medications with you on the day of surgery if instructed.   __X__  7.  Notify your doctor if there is any change in your medical condition      (cold, fever, infections).     Do not wear jewelry, make-up, hairpins, clips or nail polish. Do not wear lotions,  powders, or perfumes. You Sehgal wear deodorant. Do not shave 48 hours prior to surgery.  Do not bring valuables to the hospital.    [facility] is not responsible for any belongings or valuables.  Contacts, dentures or bridgework Parrella not be worn into surgery. Leave your suitcase in the car. After surgery it Bielinski be brought to your room. For patients admitted to the hospital, discharge time is determined by your treatment team.   Patients discharged the day of surgery will not be allowed to drive home.   Make arrangements for  someone to be with you for the first 24 hours of your Same Day Discharge.   __X__ Take these medicines the morning of surgery with A SIP OF WATER:    1. pantoprazole (PROTONIX) 40 MG   2.   3.   4.  5.  6.  ____ Fleet Enema (as directed)   __X__ Use CHG Soap (Dial Antibacterial Soap) as directed  ____ Use Benzoyl Peroxide Gel as instructed  ____ Use inhalers on the day of surgery  ____ Stop metformin 2 days prior to surgery    ____ Take 1/2 of usual insulin dose the night before surgery. No insulin the morning          of surgery.   ____ Call your PCP, cardiologist, or Pulmonologist if taking Coumadin/Plavix/aspirin and ask when to stop before your surgery.   __X__ One Week prior to surgery- Stop Anti-inflammatories such as Ibuprofen, Aleve, Advil, Motrin, meloxicam (MOBIC), diclofenac, etodolac, ketorolac, Toradol, Daypro, piroxicam, aspirin, Goody's or BC powders. OK TO USE TYLENOL IF NEEDED   __X__ Stop supplements until after surgery.    ____ Bring C-Pap to the hospital.    If you have any questions regarding your pre-procedure instructions,  Please call Pre-admit Testing at [phone]

## 2021-09-20 MED ORDER — ACETAMINOPHEN 500 MG PO TABS: 1000.0000 mg | ORAL_TABLET | ORAL | Status: AC

## 2021-09-20 MED ORDER — CHLORHEXIDINE GLUCONATE CLOTH 2 % EX PADS: 6.0000 | MEDICATED_PAD | Freq: Once | CUTANEOUS | Status: DC

## 2021-09-20 MED ORDER — CEFAZOLIN SODIUM-DEXTROSE 2-4 GM/100ML-% IV SOLN
2.0000 g | INTRAVENOUS | Status: AC
  Administered 2021-09-21: 2 g via INTRAVENOUS

## 2021-09-20 MED ORDER — LACTATED RINGERS IV SOLN: INTRAVENOUS | Status: DC

## 2021-09-20 MED ORDER — CHLORHEXIDINE GLUCONATE CLOTH 2 % EX PADS
6.0000 | MEDICATED_PAD | Freq: Once | CUTANEOUS | Status: AC
  Administered 2021-09-21: 6 via TOPICAL

## 2021-09-20 MED ORDER — INDOCYANINE GREEN 25 MG IV SOLR
2.5000 mg | INTRAVENOUS | Status: AC
  Administered 2021-09-21: 2.5 mg via INTRAVENOUS
  Filled 2021-09-20: qty 1

## 2021-09-20 MED ORDER — GABAPENTIN 300 MG PO CAPS: 300.0000 mg | ORAL_CAPSULE | ORAL | Status: AC

## 2021-09-20 MED ORDER — ORAL CARE MOUTH RINSE: 15.0000 mL | Freq: Once | OROMUCOSAL | Status: AC

## 2021-09-20 MED ORDER — CHLORHEXIDINE GLUCONATE 0.12 % MT SOLN: 15.0000 mL | Freq: Once | OROMUCOSAL | Status: AC

## 2021-09-21 ENCOUNTER — Other Ambulatory Visit: Payer: Self-pay

## 2021-09-21 ENCOUNTER — Encounter
Admission: RE | Disposition: A | Discharge status: Home / Self Care | Payer: Self-pay | Source: Home / Self Care | Attending: Surgery

## 2021-09-21 ENCOUNTER — Ambulatory Visit: Payer: Managed Care, Other (non HMO)

## 2021-09-21 ENCOUNTER — Ambulatory Visit
Admission: RE | Admit: 2021-09-21 | Discharge: 2021-09-21 | Disposition: A | Discharge status: Home / Self Care | Payer: Managed Care, Other (non HMO) | Attending: Surgery | Admitting: Surgery

## 2021-09-21 ENCOUNTER — Encounter: Payer: Self-pay | Admitting: Surgery

## 2021-09-21 DIAGNOSIS — K8010 Calculus of gallbladder with chronic cholecystitis without obstruction: Secondary | ICD-10-CM | POA: Insufficient documentation

## 2021-09-21 DIAGNOSIS — Z6838 Body mass index (BMI) 38.0-38.9, adult: Secondary | ICD-10-CM | POA: Diagnosis not present

## 2021-09-21 DIAGNOSIS — E669 Obesity, unspecified: Secondary | ICD-10-CM | POA: Insufficient documentation

## 2021-09-21 DIAGNOSIS — Z01812 Encounter for preprocedural laboratory examination: Secondary | ICD-10-CM

## 2021-09-21 DIAGNOSIS — K8020 Calculus of gallbladder without cholecystitis without obstruction: Secondary | ICD-10-CM

## 2021-09-21 LAB — POCT PREGNANCY, URINE: Preg Test, Ur: NEGATIVE

## 2021-09-21 SURGERY — CHOLECYSTECTOMY, ROBOT-ASSISTED, LAPAROSCOPIC
Anesthesia: General | Site: Abdomen

## 2021-09-21 MED ORDER — FENTANYL CITRATE (PF) 100 MCG/2ML IJ SOLN
INTRAMUSCULAR | Status: DC | PRN
  Administered 2021-09-21 (×2): 50 ug via INTRAVENOUS

## 2021-09-21 MED ORDER — BUPIVACAINE-EPINEPHRINE (PF) 0.25% -1:200000 IJ SOLN
INTRAMUSCULAR | Status: DC | PRN
  Administered 2021-09-21: 30 mL

## 2021-09-21 MED ORDER — BUPIVACAINE-EPINEPHRINE (PF) 0.5% -1:200000 IJ SOLN
INTRAMUSCULAR | Status: AC
  Filled 2021-09-21: qty 30

## 2021-09-21 MED ORDER — GABAPENTIN 300 MG PO CAPS
ORAL_CAPSULE | ORAL | Status: AC
  Administered 2021-09-21: 300 mg via ORAL
  Filled 2021-09-21: qty 1

## 2021-09-21 MED ORDER — CHLORHEXIDINE GLUCONATE 0.12 % MT SOLN
OROMUCOSAL | Status: AC
  Administered 2021-09-21: 15 mL via OROMUCOSAL
  Filled 2021-09-21: qty 15

## 2021-09-21 MED ORDER — ONDANSETRON HCL 4 MG/2ML IJ SOLN
4.0000 mg | Freq: Once | INTRAMUSCULAR | Status: AC | PRN
  Administered 2021-09-21: 4 mg via INTRAVENOUS

## 2021-09-21 MED ORDER — DEXMEDETOMIDINE (PRECEDEX) IN NS 20 MCG/5ML (4 MCG/ML) IV SYRINGE
PREFILLED_SYRINGE | INTRAVENOUS | Status: DC | PRN
  Administered 2021-09-21 (×3): 4 ug via INTRAVENOUS
  Administered 2021-09-21: 8 ug via INTRAVENOUS

## 2021-09-21 MED ORDER — PROPOFOL 10 MG/ML IV BOLUS
INTRAVENOUS | Status: DC | PRN
  Administered 2021-09-21: 200 mg via INTRAVENOUS

## 2021-09-21 MED ORDER — ROCURONIUM BROMIDE 100 MG/10ML IV SOLN
INTRAVENOUS | Status: DC | PRN
  Administered 2021-09-21: 60 mg via INTRAVENOUS
  Administered 2021-09-21: 10 mg via INTRAVENOUS

## 2021-09-21 MED ORDER — DROPERIDOL 2.5 MG/ML IJ SOLN
INTRAMUSCULAR | Status: AC
  Filled 2021-09-21: qty 2

## 2021-09-21 MED ORDER — ONDANSETRON HCL 4 MG/2ML IJ SOLN
INTRAMUSCULAR | Status: AC
  Filled 2021-09-21: qty 2

## 2021-09-21 MED ORDER — PHENYLEPHRINE HCL (PRESSORS) 10 MG/ML IV SOLN
INTRAVENOUS | Status: DC | PRN
  Administered 2021-09-21 (×4): 160 ug via INTRAVENOUS

## 2021-09-21 MED ORDER — DEXAMETHASONE SODIUM PHOSPHATE 10 MG/ML IJ SOLN
INTRAMUSCULAR | Status: DC | PRN
  Administered 2021-09-21: 10 mg via INTRAVENOUS

## 2021-09-21 MED ORDER — FENTANYL CITRATE (PF) 100 MCG/2ML IJ SOLN
INTRAMUSCULAR | Status: AC
  Filled 2021-09-21: qty 2

## 2021-09-21 MED ORDER — BUPIVACAINE-EPINEPHRINE (PF) 0.25% -1:200000 IJ SOLN
INTRAMUSCULAR | Status: AC
  Filled 2021-09-21: qty 30

## 2021-09-21 MED ORDER — 0.9 % SODIUM CHLORIDE (POUR BTL) OPTIME
TOPICAL | Status: DC | PRN
  Administered 2021-09-21: 500 mL

## 2021-09-21 MED ORDER — ONDANSETRON HCL 4 MG PO TABS: 4.0000 mg | ORAL_TABLET | Freq: Three times a day (TID) | ORAL | 0 refills | Status: AC | PRN

## 2021-09-21 MED ORDER — DEXAMETHASONE SODIUM PHOSPHATE 10 MG/ML IJ SOLN
INTRAMUSCULAR | Status: AC
  Filled 2021-09-21: qty 1

## 2021-09-21 MED ORDER — KETOROLAC TROMETHAMINE 30 MG/ML IJ SOLN
INTRAMUSCULAR | Status: DC | PRN
  Administered 2021-09-21: 30 mg via INTRAVENOUS

## 2021-09-21 MED ORDER — IBUPROFEN 800 MG PO TABS: 800.0000 mg | ORAL_TABLET | Freq: Three times a day (TID) | ORAL | 1 refills | Status: AC | PRN

## 2021-09-21 MED ORDER — CEFAZOLIN SODIUM-DEXTROSE 2-4 GM/100ML-% IV SOLN
INTRAVENOUS | Status: AC
  Filled 2021-09-21: qty 100

## 2021-09-21 MED ORDER — HYDROMORPHONE HCL 1 MG/ML IJ SOLN
INTRAMUSCULAR | Status: DC | PRN
  Administered 2021-09-21: 1 mg via INTRAVENOUS

## 2021-09-21 MED ORDER — MIDAZOLAM HCL 2 MG/2ML IJ SOLN
INTRAMUSCULAR | Status: AC
  Filled 2021-09-21: qty 2

## 2021-09-21 MED ORDER — DROPERIDOL 2.5 MG/ML IJ SOLN
0.6250 mg | Freq: Once | INTRAMUSCULAR | Status: AC
  Administered 2021-09-21: 0.625 mg via INTRAVENOUS

## 2021-09-21 MED ORDER — HYDROMORPHONE HCL 1 MG/ML IJ SOLN
INTRAMUSCULAR | Status: AC
  Filled 2021-09-21: qty 1

## 2021-09-21 MED ORDER — FENTANYL CITRATE (PF) 100 MCG/2ML IJ SOLN
25.0000 ug | INTRAMUSCULAR | Status: DC | PRN
  Administered 2021-09-21 (×2): 25 ug via INTRAVENOUS

## 2021-09-21 MED ORDER — MIDAZOLAM HCL 2 MG/2ML IJ SOLN
INTRAMUSCULAR | Status: DC | PRN
  Administered 2021-09-21: 2 mg via INTRAVENOUS

## 2021-09-21 MED ORDER — EPHEDRINE SULFATE (PRESSORS) 50 MG/ML IJ SOLN
INTRAMUSCULAR | Status: DC | PRN
  Administered 2021-09-21: 10 mg via INTRAVENOUS

## 2021-09-21 MED ORDER — PHENYLEPHRINE 80 MCG/ML (10ML) SYRINGE FOR IV PUSH (FOR BLOOD PRESSURE SUPPORT)
PREFILLED_SYRINGE | INTRAVENOUS | Status: AC
  Filled 2021-09-21: qty 10

## 2021-09-21 MED ORDER — BUPIVACAINE LIPOSOME 1.3 % IJ SUSP
INTRAMUSCULAR | Status: AC
  Filled 2021-09-21: qty 20

## 2021-09-21 MED ORDER — OXYCODONE HCL 5 MG PO TABS: 5.0000 mg | ORAL_TABLET | ORAL | 0 refills | Status: AC | PRN

## 2021-09-21 MED ORDER — LIDOCAINE HCL (CARDIAC) PF 100 MG/5ML IV SOSY
PREFILLED_SYRINGE | INTRAVENOUS | Status: DC | PRN
  Administered 2021-09-21: 100 mg via INTRAVENOUS

## 2021-09-21 MED ORDER — ONDANSETRON HCL 4 MG/2ML IJ SOLN
INTRAMUSCULAR | Status: DC | PRN
  Administered 2021-09-21: 4 mg via INTRAVENOUS

## 2021-09-21 MED ORDER — EPHEDRINE 5 MG/ML INJ
INTRAVENOUS | Status: AC
  Filled 2021-09-21: qty 5

## 2021-09-21 MED ORDER — ACETAMINOPHEN 500 MG PO TABS
ORAL_TABLET | ORAL | Status: AC
  Administered 2021-09-21: 1000 mg via ORAL
  Filled 2021-09-21: qty 2

## 2021-09-21 MED ORDER — SUGAMMADEX SODIUM 200 MG/2ML IV SOLN
INTRAVENOUS | Status: DC | PRN
  Administered 2021-09-21: 300 mg via INTRAVENOUS

## 2021-09-21 MED ORDER — ACETAMINOPHEN 500 MG PO TABS: 1000.0000 mg | ORAL_TABLET | Freq: Four times a day (QID) | ORAL | Status: AC | PRN

## 2021-09-21 SURGICAL SUPPLY — 54 items
ADH SKN CLS APL DERMABOND .7 (GAUZE/BANDAGES/DRESSINGS) ×1
BAG PRESSURE INF REUSE 1000 (BAG) IMPLANT
CANNULA REDUC XI 12-8 STAPL (CANNULA) ×1
CANNULA REDUCER 12-8 DVNC XI (CANNULA) ×1 IMPLANT
CLIP LIGATING HEMO O LOK GREEN (MISCELLANEOUS) ×3 IMPLANT
CUP MEDICINE 2OZ PLAST GRAD ST (MISCELLANEOUS) ×2 IMPLANT
DERMABOND ADVANCED (GAUZE/BANDAGES/DRESSINGS) ×1
DERMABOND ADVANCED .7 DNX12 (GAUZE/BANDAGES/DRESSINGS) ×1 IMPLANT
DRAPE ARM DVNC X/XI (DISPOSABLE) ×4 IMPLANT
DRAPE COLUMN DVNC XI (DISPOSABLE) ×1 IMPLANT
DRAPE DA VINCI XI ARM (DISPOSABLE) ×4
DRAPE DA VINCI XI COLUMN (DISPOSABLE) ×1
ELECT CAUTERY BLADE TIP 2.5 (TIP) ×2
ELECT REM PT RETURN 9FT ADLT (ELECTROSURGICAL) ×2
ELECTRODE CAUTERY BLDE TIP 2.5 (TIP) ×1 IMPLANT
ELECTRODE REM PT RTRN 9FT ADLT (ELECTROSURGICAL) ×1 IMPLANT
GLOVE SURG SYN 7.0 (GLOVE) ×4 IMPLANT
GLOVE SURG SYN 7.0 PF PI (GLOVE) ×2 IMPLANT
GLOVE SURG SYN 7.5  E (GLOVE) ×2
GLOVE SURG SYN 7.5 E (GLOVE) ×2 IMPLANT
GLOVE SURG SYN 7.5 PF PI (GLOVE) ×2 IMPLANT
GOWN STRL REUS W/ TWL LRG LVL3 (GOWN DISPOSABLE) ×4 IMPLANT
GOWN STRL REUS W/TWL LRG LVL3 (GOWN DISPOSABLE) ×8
IRRIGATOR SUCT 8 DISP DVNC XI (IRRIGATION / IRRIGATOR) IMPLANT
IRRIGATOR SUCTION 8MM XI DISP (IRRIGATION / IRRIGATOR)
IV NS 1000ML (IV SOLUTION)
IV NS 1000ML BAXH (IV SOLUTION) IMPLANT
KIT PINK PAD W/HEAD ARE REST (MISCELLANEOUS) ×2
KIT PINK PAD W/HEAD ARM REST (MISCELLANEOUS) ×1 IMPLANT
LABEL OR SOLS (LABEL) ×2 IMPLANT
MANIFOLD NEPTUNE II (INSTRUMENTS) ×2 IMPLANT
NEEDLE HYPO 22GX1.5 SAFETY (NEEDLE) ×2 IMPLANT
NS IRRIG 500ML POUR BTL (IV SOLUTION) ×2 IMPLANT
OBTURATOR OPTICAL STANDARD 8MM (TROCAR) ×1
OBTURATOR OPTICAL STND 8 DVNC (TROCAR) ×1
OBTURATOR OPTICALSTD 8 DVNC (TROCAR) ×1 IMPLANT
PACK LAP CHOLECYSTECTOMY (MISCELLANEOUS) ×2 IMPLANT
PENCIL ELECTRO HAND CTR (MISCELLANEOUS) ×2 IMPLANT
SEAL CANN UNIV 5-8 DVNC XI (MISCELLANEOUS) ×3 IMPLANT
SEAL XI 5MM-8MM UNIVERSAL (MISCELLANEOUS) ×3
SET TUBE SMOKE EVAC HIGH FLOW (TUBING) ×2 IMPLANT
SOLUTION ELECTROLUBE (MISCELLANEOUS) ×2 IMPLANT
SPIKE FLUID TRANSFER (MISCELLANEOUS) ×2 IMPLANT
SPONGE T-LAP 18X18 ~~LOC~~+RFID (SPONGE) IMPLANT
SPONGE T-LAP 4X18 ~~LOC~~+RFID (SPONGE) ×2 IMPLANT
STAPLER CANNULA SEAL DVNC XI (STAPLE) ×1 IMPLANT
STAPLER CANNULA SEAL XI (STAPLE) ×1
SUT MNCRL AB 4-0 PS2 18 (SUTURE) ×2 IMPLANT
SUT VIC AB 3-0 SH 27 (SUTURE)
SUT VIC AB 3-0 SH 27X BRD (SUTURE) IMPLANT
SUT VICRYL 0 AB UR-6 (SUTURE) ×4 IMPLANT
SYS BAG RETRIEVAL 10MM (BASKET) ×2
SYSTEM BAG RETRIEVAL 10MM (BASKET) ×1 IMPLANT
TAPE TRANSPORE STRL 2 31045 (GAUZE/BANDAGES/DRESSINGS) ×2 IMPLANT

## 2021-09-21 NOTE — Op Note (Signed)
  Procedure Date:  09/21/2021  Pre-operative Diagnosis:  Symptomatic cholelithiasis  Post-operative Diagnosis: Symptomatic cholelithiasis  Procedure:  Robotic assisted cholecystectomy with ICG FireFly cholangiogram  Surgeon:  Melvyn Neth, MD  Anesthesia:  General endotracheal  Estimated Blood Loss:  10 ml  Specimens:  gallbladder  Complications:  None  Indications for Procedure:  This is a 41 y.o. female who presents with abdominal pain and workup revealing symptomatic cholelithiasis.  The benefits, complications, treatment options, and expected outcomes were discussed with the patient. The risks of bleeding, infection, recurrence of symptoms, failure to resolve symptoms, bile duct damage, bile duct leak, retained common bile duct stone, bowel injury, and need for further procedures were all discussed with the patient and she was willing to proceed.  Description of Procedure: The patient was correctly identified in the preoperative area and brought into the operating room.  The patient was placed supine with VTE prophylaxis in place.  Appropriate time-outs were performed.  Anesthesia was induced and the patient was intubated.  Appropriate antibiotics were infused.  The abdomen was prepped and draped in a sterile fashion. An infraumbilical incision was made. A cutdown technique was used to enter the abdominal cavity without injury, and a 12 mm robotic port was inserted.  Pneumoperitoneum was obtained with appropriate opening pressures.  Three 8-mm ports were placed in the mid abdomen at the level of the umbilicus under direct visualization.  The DaVinci platform was docked, camera targeted, and instruments were placed under direct visualization.  The gallbladder was identified.  The fundus was grasped and retracted cephalad.  Adhesions were lysed bluntly and with electrocautery. The infundibulum was grasped and retracted laterally, exposing the peritoneum overlying the gallbladder.  This  was incised with electrocautery and extended on either side of the gallbladder.  FireFly cholangiogram was then obtained, and we were able to clearly identify the cystic duct and common bile duct.  The cystic duct and cystic artery were carefully dissected with combination of cautery and blunt dissection.  Both were clipped twice proximally and once distally, cutting in between.  The gallbladder was taken from the gallbladder fossa in a retrograde fashion with electrocautery. The gallbladder was placed in an Endocatch bag. The liver bed was inspected and any bleeding was controlled with electrocautery. The right upper quadrant was then inspected again revealing intact clips, no bleeding, and no ductal injury.   The 8 mm ports were removed under direct visualization and the 12 mm port was removed.  The Endocatch bag was brought out via the umbilical incision. The fascial opening was closed using 0 vicryl suture.  Local anesthetic was infused in all incisions and the incisions were closed with 4-0 Monocryl.  The wounds were cleaned and sealed with DermaBond.  The patient was emerged from anesthesia and extubated and brought to the recovery room for further management.  The patient tolerated the procedure well and all counts were correct at the end of the case.   Melvyn Neth, MD

## 2021-09-21 NOTE — Discharge Instructions (Signed)
AMBULATORY SURGERY  ?DISCHARGE INSTRUCTIONS ? ? ?The drugs that you were given will stay in your system until tomorrow so for the next 24 hours you should not: ? ?Drive an automobile ?Make any legal decisions ?Drink any alcoholic beverage ? ? ?You Manternach resume regular meals tomorrow.  Today it is better to start with liquids and gradually work up to solid foods. ? ?You Swarthout eat anything you prefer, but it is better to start with liquids, then soup and crackers, and gradually work up to solid foods. ? ? ?Please notify your doctor immediately if you have any unusual bleeding, trouble breathing, redness and pain at the surgery site, drainage, fever, or pain not relieved by medication. ? ? ? ?Additional Instructions: ? ? ? ?Please contact your physician with any problems or Same Day Surgery at [phone], Monday through Friday 6 am to 4 pm, or [place] at [place] Main number at [phone].  ?

## 2021-09-21 NOTE — Transfer of Care (Signed)
Immediate Anesthesia Transfer of Care Note  Patient: Julie Buck  Procedure(s) Performed: XI ROBOTIC ASSISTED LAPAROSCOPIC CHOLECYSTECTOMY (Abdomen) INDOCYANINE GREEN FLUORESCENCE IMAGING (ICG) (Abdomen)  Patient Location: PACU  Anesthesia Type:General  Level of Consciousness: drowsy  Airway & Oxygen Therapy: Patient Spontanous Breathing and Patient connected to face mask oxygen  Post-op Assessment: Report given to RN and Post -op Vital signs reviewed and stable  Post vital signs: Reviewed and stable  Last Vitals:  Vitals Value Taken Time  BP    Temp    Pulse    Resp    SpO2      Last Pain:  Vitals:   09/21/21 1152  TempSrc: Temporal  PainSc: 5          Complications: No notable events documented.

## 2021-09-21 NOTE — Interval H&P Note (Signed)
History and Physical Interval Note:  09/21/2021 12:54 PM  [name]  has presented today for surgery, with the diagnosis of symptomatic cholelithiasis.  The various methods of treatment have been discussed with the patient and family. After consideration of risks, benefits and other options for treatment, the patient has consented to  Procedure(s): XI ROBOTIC ASSISTED LAPAROSCOPIC CHOLECYSTECTOMY (N/A) [place] (ICG) (N/A) as a surgical intervention.  The patient's history has been reviewed, patient examined, no change in status, stable for surgery.  I have reviewed the patient's chart and labs.  Questions were answered to the patient's satisfaction.     Julie Buck

## 2021-09-21 NOTE — Anesthesia Procedure Notes (Signed)
Procedure Name: Intubation Date/Time: 09/21/2021 1:16 PM  Performed by: Beverely Low, CRNAPre-anesthesia Checklist: Patient identified, Patient being monitored, Timeout performed, Emergency Drugs available and Suction available Patient Re-evaluated:Patient Re-evaluated prior to induction Oxygen Delivery Method: Circle system utilized Preoxygenation: Pre-oxygenation with 100% oxygen Induction Type: IV induction Ventilation: Mask ventilation without difficulty Laryngoscope Size: Mac and 3 Grade View: Grade I Tube type: Oral Tube size: 7.0 mm Number of attempts: 1 Airway Equipment and Method: Stylet Placement Confirmation: ETT inserted through vocal cords under direct vision, positive ETCO2 and breath sounds checked- equal and bilateral Secured at: 21 cm Tube secured with: Tape Dental Injury: Teeth and Oropharynx as per pre-operative assessment  Comments: Intubated by Everlene Other SRNA

## 2021-09-22 NOTE — Anesthesia Postprocedure Evaluation (Signed)
Anesthesia Post Note  Patient: Julie Buck  Procedure(s) Performed: XI ROBOTIC ASSISTED LAPAROSCOPIC CHOLECYSTECTOMY (Abdomen) INDOCYANINE GREEN FLUORESCENCE IMAGING (ICG) (Abdomen)  Patient location during evaluation: PACU Anesthesia Type: General Level of consciousness: oriented and awake and alert Pain management: pain level controlled Vital Signs Assessment: post-procedure vital signs reviewed and stable Respiratory status: spontaneous breathing and respiratory function stable Cardiovascular status: stable Anesthetic complications: no   No notable events documented.   Last Vitals:  Vitals:   09/21/21 1640 09/21/21 1712  BP: 125/72 112/67  Pulse: 84 85  Resp: 16 16  Temp: 36.6 C   SpO2: 93% 93%    Last Pain:  Vitals:   09/21/21 1640  TempSrc: Temporal  PainSc: 5                  Julie Buck

## 2021-09-25 LAB — SURGICAL PATHOLOGY

## 2021-10-05 ENCOUNTER — Encounter: Payer: Self-pay | Admitting: Physician Assistant

## 2021-10-05 ENCOUNTER — Ambulatory Visit (INDEPENDENT_AMBULATORY_CARE_PROVIDER_SITE_OTHER): Payer: Managed Care, Other (non HMO) | Admitting: Physician Assistant

## 2021-10-05 DIAGNOSIS — K8020 Calculus of gallbladder without cholecystitis without obstruction: Secondary | ICD-10-CM

## 2021-10-05 DIAGNOSIS — Z09 Encounter for follow-up examination after completed treatment for conditions other than malignant neoplasm: Secondary | ICD-10-CM

## 2021-10-05 NOTE — Progress Notes (Signed)
[facility] POST-OP OFFICE VISIT  10/05/2021  HPI: Julie Buck is a 41 y.o. female 14 days s/p robotic assisted laparoscopic cholecystectomy for symptomatic cholelithiasis with Dr Hampton Abbot   She is overall doing well Pain worse at the incision to the right of umbilicus; mostly with movements No fever, chills, nausea, emesis, or bowel changes Tolerating PO No other complaints   Physical Exam: Constitutional: Well appearing female, NAD Abdomen: Soft, soreness at incision to the right of the umbilicus otherwise non-tender, non-distended, no rebound/guarding Skin: Laparoscopic incisions are healing well, no erythema or drainage   Assessment/Plan: This is a 42 y.o. female 14 days s/p robotic assisted laparoscopic cholecystectomy for symptomatic cholelithiasis    - Pain control prn  - Reviewed wound care recommendation  - Reviewed lifting restrictions; 4 weeks total  - Reviewed surgical pathology; [place]  - She can follow up on as needed basis; She understands to call with questions/concerns  -- Edison Simon, PA-C [facility] 10/05/2021, 3:23 PM M-F: 7am - 4pm

## 2021-10-05 NOTE — Patient Instructions (Signed)
If you have any concerns or questions, please feel free to call our office.    GENERAL POST-OPERATIVE PATIENT INSTRUCTIONS   WOUND CARE INSTRUCTIONS:  Keep a dry clean dressing on the wound if there is drainage. The initial bandage Duchene be removed after 24 hours.  Once the wound has quit draining you Azbill leave it open to air.  If clothing rubs against the wound or causes irritation and the wound is not draining you Harn cover it with a dry dressing during the daytime.  Try to keep the wound dry and avoid ointments on the wound unless directed to do so.  If the wound becomes bright red and painful or starts to drain infected material that is not clear, please contact your physician immediately.  If the wound is mildly pink and has a thick firm ridge underneath it, this is normal, and is referred to as a healing ridge.  This will resolve over the next 4-6 weeks.  BATHING: You Vogt shower if you have been informed of this by your surgeon. However, Please do not submerge in a tub, hot tub, or pool until incisions are completely sealed or have been told by your surgeon that you [place] do so.  DIET:  You Reza eat any foods that you can tolerate.  It is a good idea to eat a high fiber diet and take in plenty of fluids to prevent constipation.  If you do become constipated you Garrod want to take a mild laxative or take ducolax tablets on a daily basis until your bowel habits are regular.  Constipation can be very uncomfortable, along with straining, after recent surgery.  ACTIVITY:  You are encouraged to cough and deep breath or use your incentive spirometer if you were given one, every 15-30 minutes when awake.  This will help prevent respiratory complications and low grade fevers post-operatively if you had a general anesthetic.  You Spidle want to hug a pillow when coughing and sneezing to add additional support to the surgical area, if you had abdominal or chest surgery, which will decrease pain during these times.   You are encouraged to walk and engage in light activity for the next two weeks.  You should not lift more than 20 pounds for 6 weeks total after surgery as it could put you at increased risk for complications.  Twenty pounds is roughly equivalent to a plastic bag of groceries. At that time- Listen to your body when lifting, if you have pain when lifting, stop and then try again in a few days. Soreness after doing exercises or activities of daily living is normal as you get back in to your normal routine.  MEDICATIONS:  Try to take narcotic medications and anti-inflammatory medications, such as tylenol, ibuprofen, naprosyn, etc., with food.  This will minimize stomach upset from the medication.  Should you develop nausea and vomiting from the pain medication, or develop a rash, please discontinue the medication and contact your physician.  You should not drive, make important decisions, or operate machinery when taking narcotic pain medication.  SUNBLOCK Use sun block to incision area over the next year if this area will be exposed to sun. This helps decrease scarring and will allow you avoid a permanent darkened area over your incision.  QUESTIONS:  Please feel free to call our office if you have any questions, and we will be glad to assist you. [phone]   

## 2021-10-10 ENCOUNTER — Encounter: Payer: Managed Care, Other (non HMO) | Admitting: Certified Nurse Midwife

## 2023-06-02 IMAGING — CR DG CHEST 2V
2 series · 2 of 2 positions shown · non-contrast
Comparison: None Available.

CLINICAL DATA: Midsternal chest pain and nausea.

EXAM:
CHEST - 2 VIEW

[chest pa]
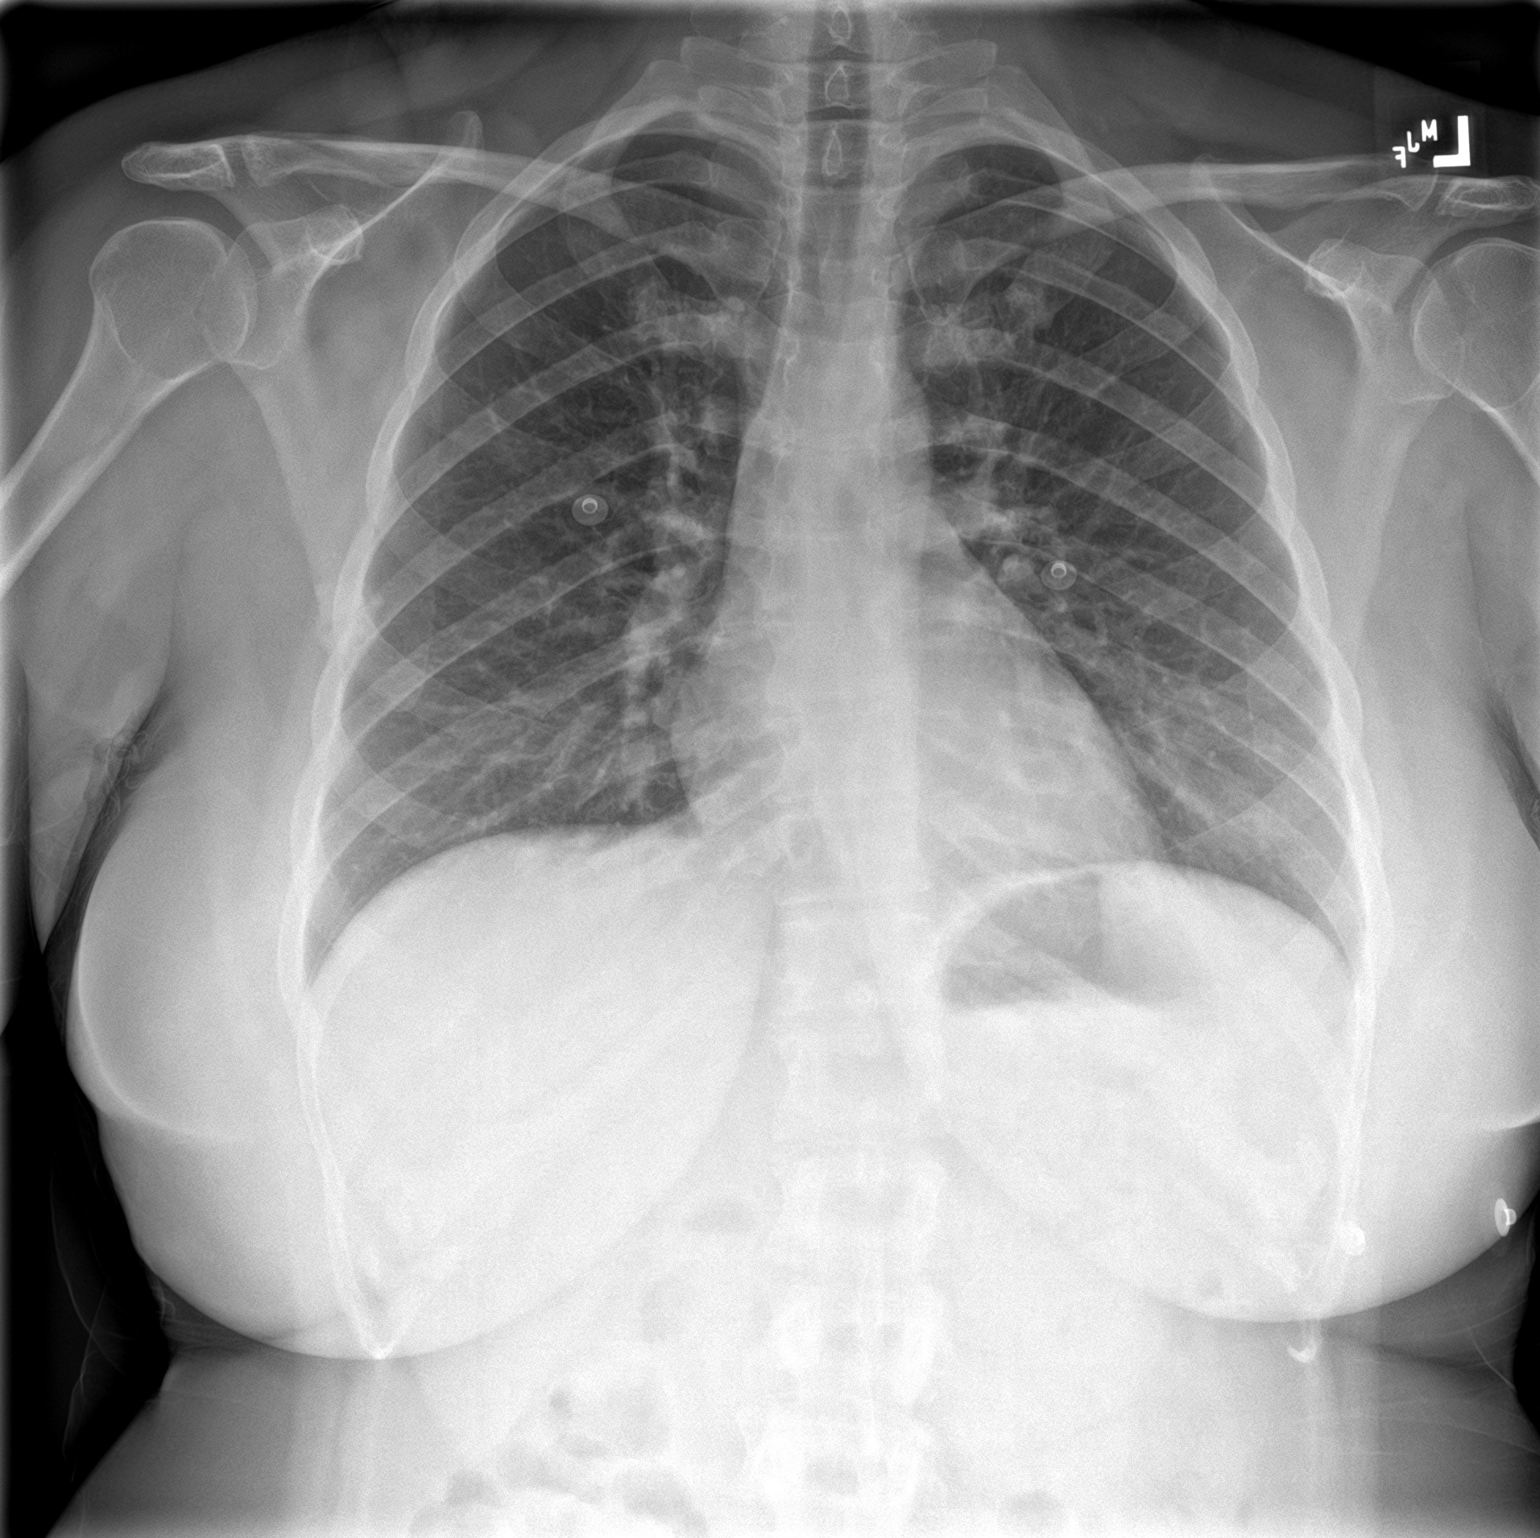

[chest lat]
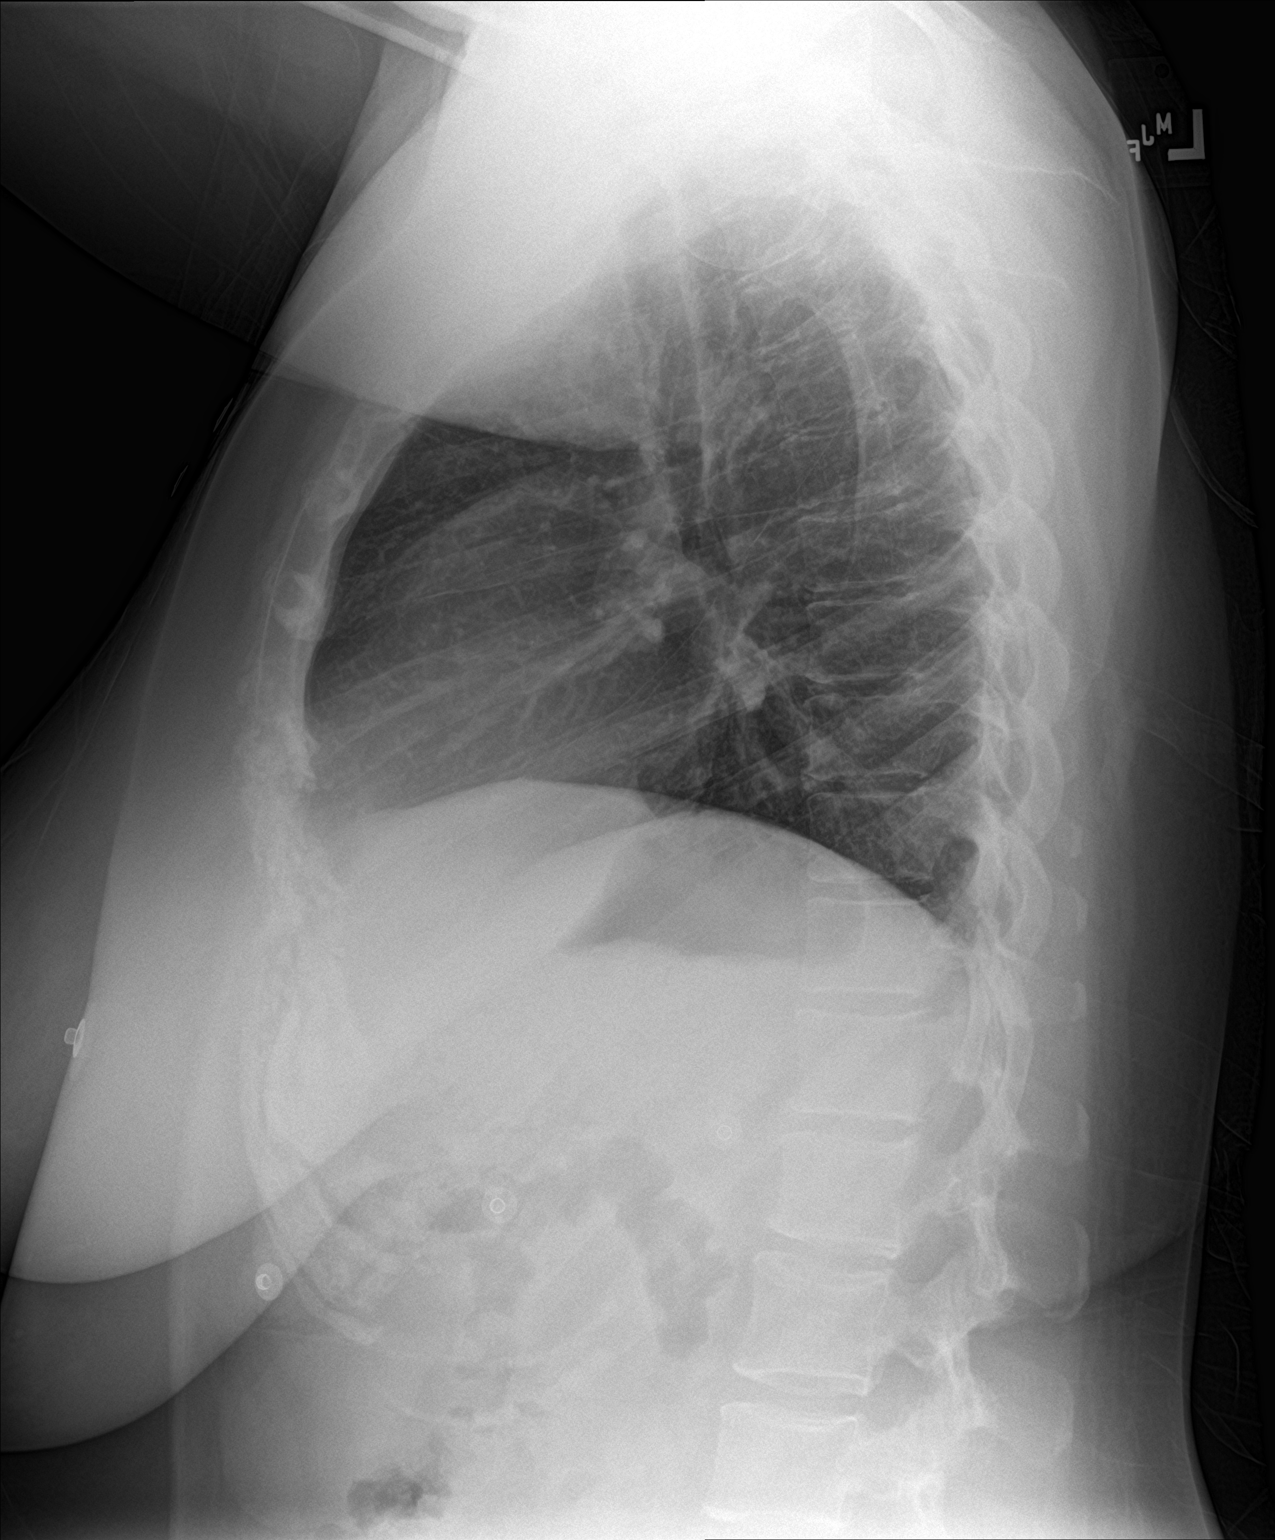

[2 of 2 positions shown; findings below may reference images not displayed]

FINDINGS: The heart size and mediastinal contours are within normal limits.
Mildly decreased lung volumes are noted. Both lungs are clear. No
acute osseous abnormalities are identified.
IMPRESSION: No active cardiopulmonary disease.

## 2023-06-17 IMAGING — US US ABDOMEN COMPLETE
1 series · 14 of 25 positions shown · non-contrast
Comparison: None Available.

CLINICAL DATA: Epigastric pain

EXAM:
ABDOMEN ULTRASOUND COMPLETE

[Series 1: us abdomen complete · 14 of 63 slices shown]
[im 1/63]
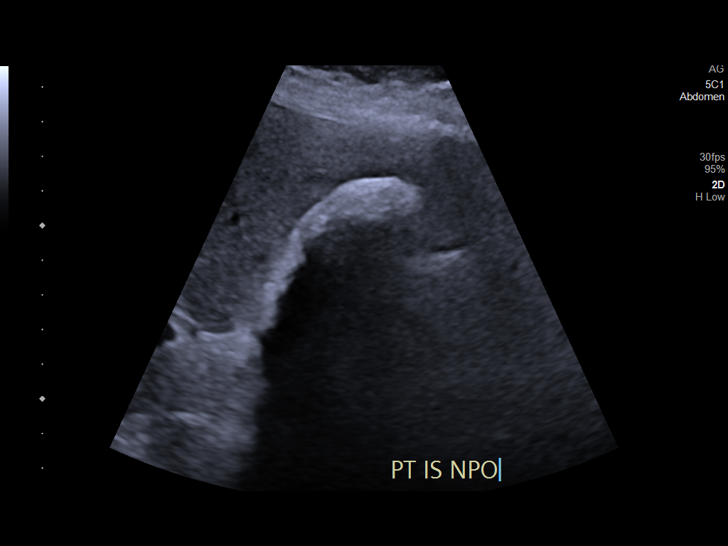
[im 6/63]
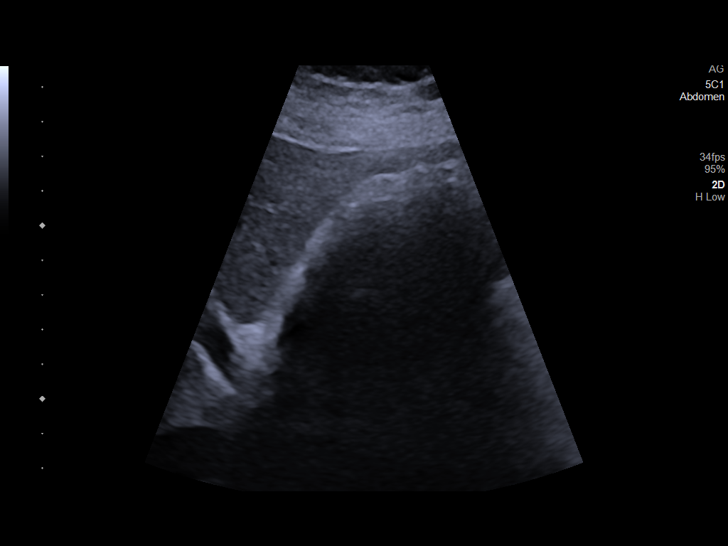
[im 11/63]
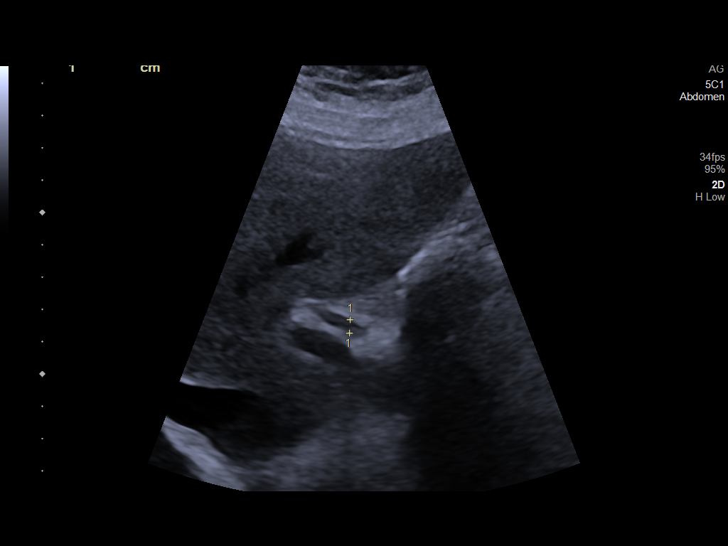
[im 16/63]
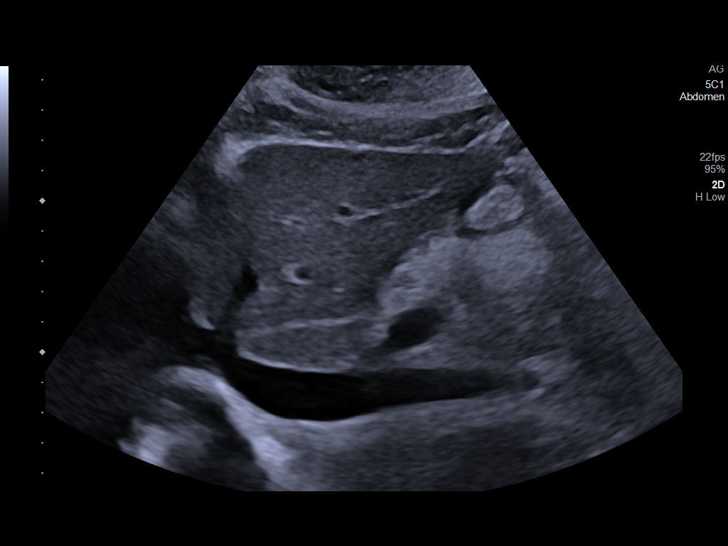
[im 21/63]
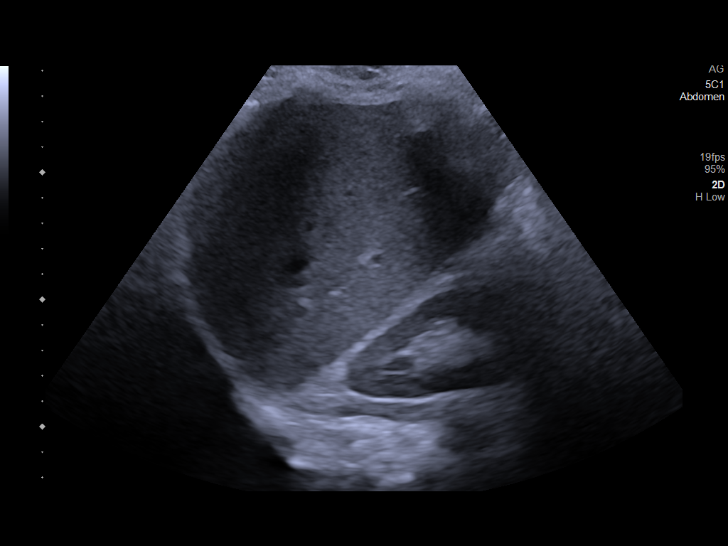
[im 24/63]
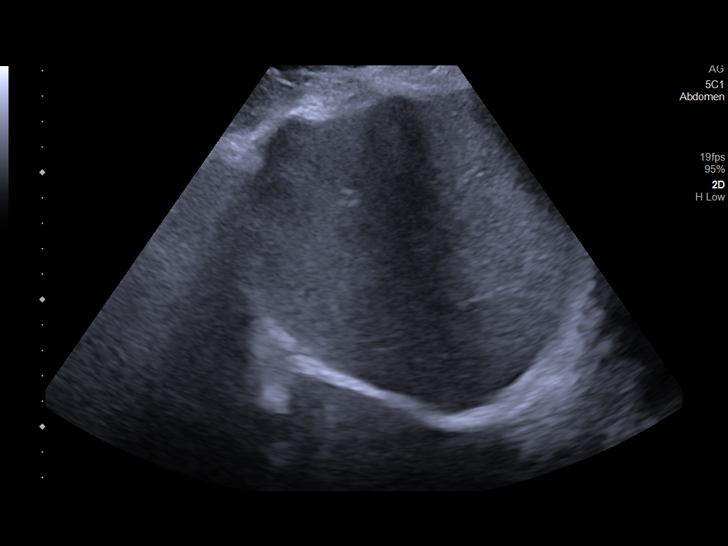
[im 29/63]
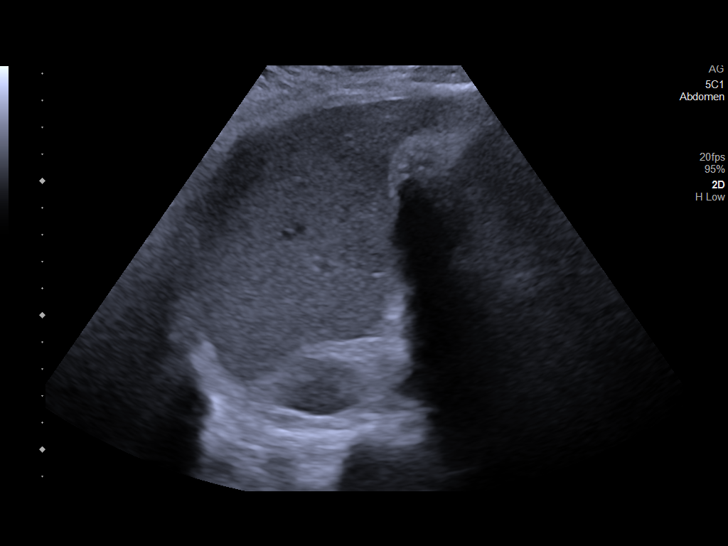
[im 34/63]
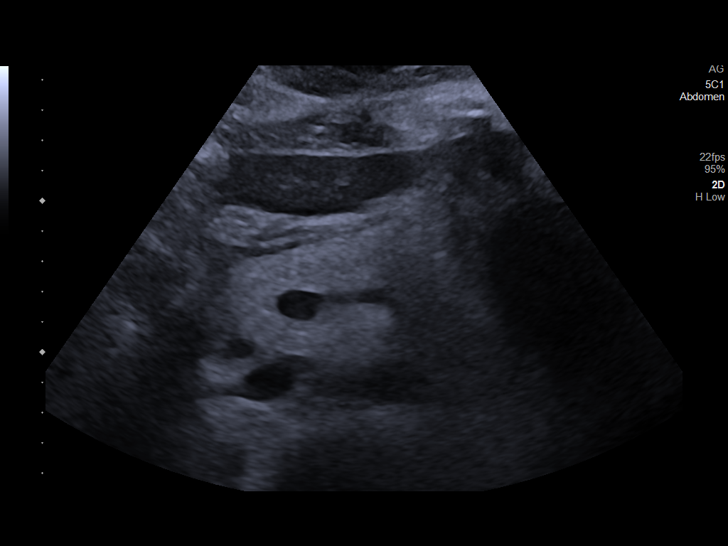
[im 39/63]
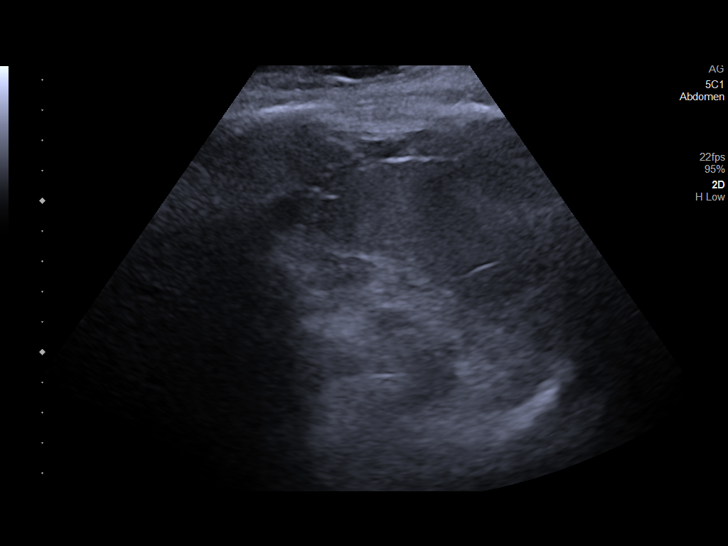
[im 42/63]
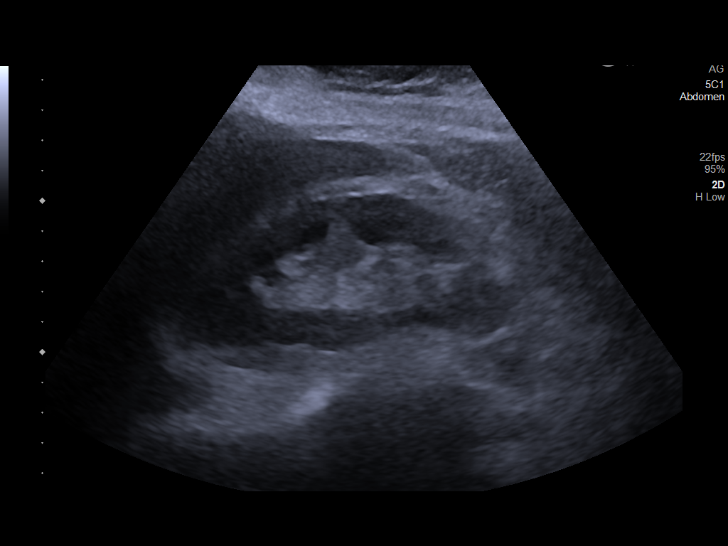
[im 47/63]
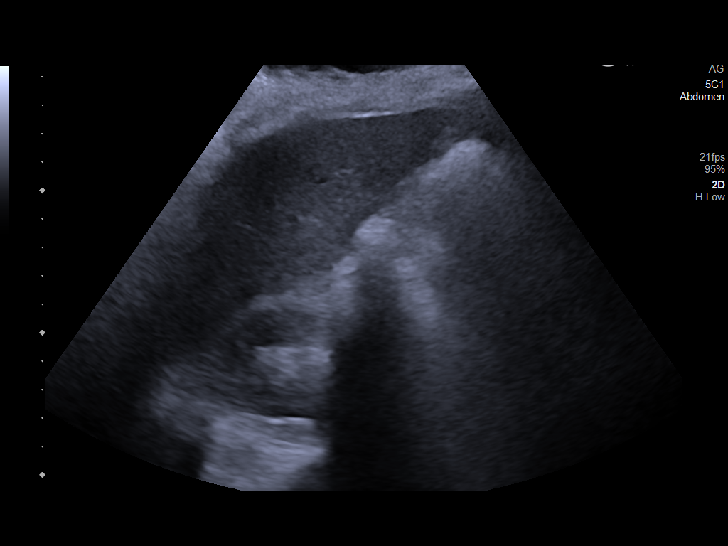
[im 52/63]
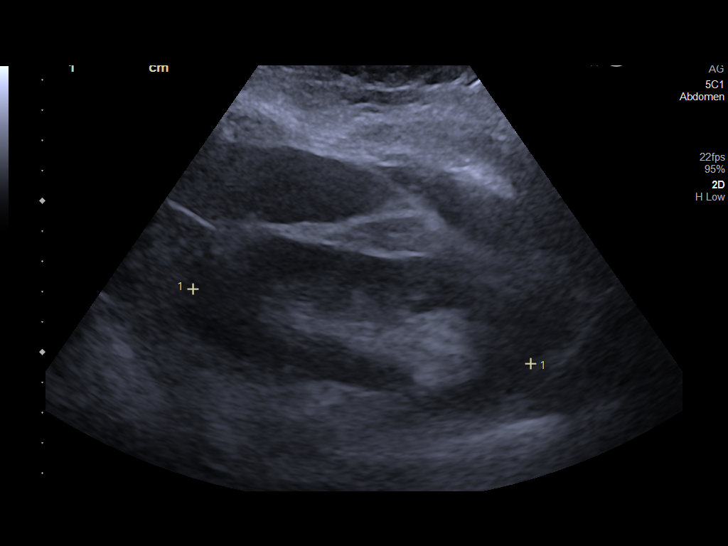
[im 57/63]
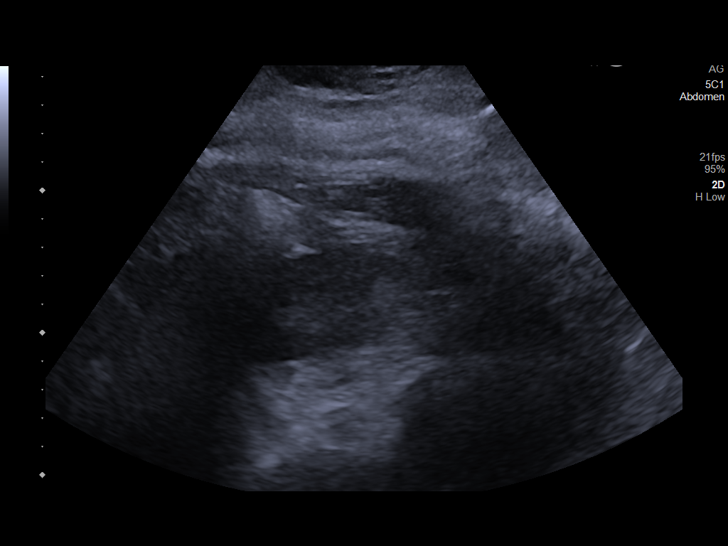
[im 63/63]
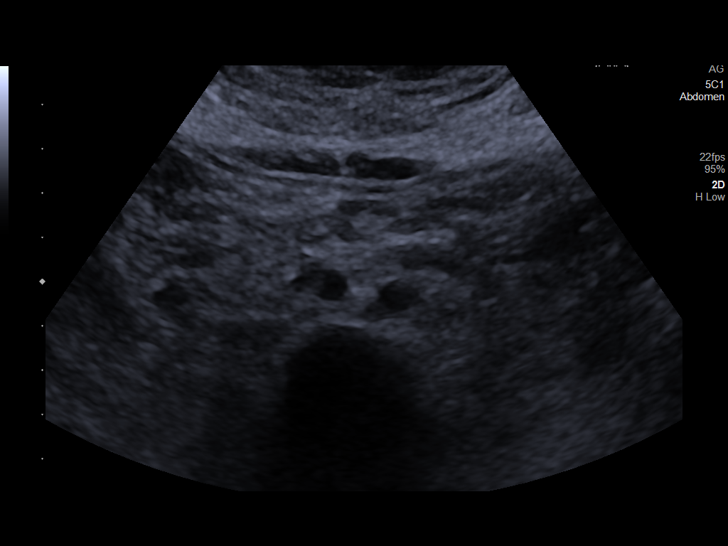

[14 of 25 positions shown; findings below may reference images not displayed]

FINDINGS: Gallbladder: Appears as echogenic rim with diffuse posterior
acoustic shadowing suggesting filled with stones. Negative
sonographic Murphy's sign.

Common bile duct: Diameter: 4 mm

Liver: No focal lesion identified. Within normal limits in
parenchymal echogenicity. Portal vein is patent on color Doppler
imaging with normal direction of blood flow towards the liver.

IVC: No abnormality visualized.

Pancreas: Visualized portion unremarkable.

Spleen: Size and appearance within normal limits.

Right Kidney: Length: 11.5 cm. Echogenicity within normal limits. No
mass or hydronephrosis visualized.

Left Kidney: Length: 11.4 cm. Echogenicity within normal limits. No
mass or hydronephrosis visualized.

Abdominal aorta: No aneurysm visualized.

Other findings: None.
IMPRESSION: Cholelithiasis.  Gallbladder is completely filled with stones.
